# Patient Record
Sex: Male | Born: 1985 | Race: Black or African American | Hispanic: No | Marital: Single
Health system: Southern US, Community
[De-identification: ages and names within clinical notes are randomized; demographics above are authoritative.]

## PROBLEM LIST (undated history)

## (undated) DIAGNOSIS — I10 Essential (primary) hypertension: Secondary | ICD-10-CM

---

## 2007-06-28 ENCOUNTER — Telehealth (INDEPENDENT_AMBULATORY_CARE_PROVIDER_SITE_OTHER): Payer: Self-pay | Admitting: *Deleted

## 2007-06-28 ENCOUNTER — Ambulatory Visit: Payer: Self-pay | Admitting: Family Medicine

## 2007-06-28 ENCOUNTER — Ambulatory Visit (HOSPITAL_COMMUNITY): Admission: RE | Admit: 2007-06-28 | Discharge: 2007-06-28 | Payer: Self-pay | Admitting: Family Medicine

## 2007-06-28 DIAGNOSIS — R55 Syncope and collapse: Secondary | ICD-10-CM | POA: Insufficient documentation

## 2007-06-28 DIAGNOSIS — Z8679 Personal history of other diseases of the circulatory system: Secondary | ICD-10-CM | POA: Insufficient documentation

## 2007-06-28 DIAGNOSIS — R0609 Other forms of dyspnea: Secondary | ICD-10-CM | POA: Insufficient documentation

## 2007-06-28 DIAGNOSIS — R0602 Shortness of breath: Secondary | ICD-10-CM | POA: Insufficient documentation

## 2007-06-29 ENCOUNTER — Encounter (INDEPENDENT_AMBULATORY_CARE_PROVIDER_SITE_OTHER): Payer: Self-pay | Admitting: Family Medicine

## 2007-07-12 ENCOUNTER — Ambulatory Visit: Payer: Self-pay | Admitting: Family Medicine

## 2007-07-12 ENCOUNTER — Telehealth (INDEPENDENT_AMBULATORY_CARE_PROVIDER_SITE_OTHER): Payer: Self-pay | Admitting: *Deleted

## 2007-07-23 ENCOUNTER — Ambulatory Visit: Admission: RE | Admit: 2007-07-23 | Discharge: 2007-07-23 | Payer: Self-pay | Admitting: Family Medicine

## 2007-07-23 ENCOUNTER — Encounter (INDEPENDENT_AMBULATORY_CARE_PROVIDER_SITE_OTHER): Payer: Self-pay | Admitting: Family Medicine

## 2007-07-26 ENCOUNTER — Ambulatory Visit (HOSPITAL_COMMUNITY): Admission: RE | Admit: 2007-07-26 | Discharge: 2007-07-26 | Payer: Self-pay | Admitting: Family Medicine

## 2007-07-26 ENCOUNTER — Telehealth (INDEPENDENT_AMBULATORY_CARE_PROVIDER_SITE_OTHER): Payer: Self-pay | Admitting: *Deleted

## 2007-07-26 ENCOUNTER — Encounter (INDEPENDENT_AMBULATORY_CARE_PROVIDER_SITE_OTHER): Payer: Self-pay | Admitting: Family Medicine

## 2007-07-27 ENCOUNTER — Encounter (INDEPENDENT_AMBULATORY_CARE_PROVIDER_SITE_OTHER): Payer: Self-pay | Admitting: Family Medicine

## 2007-07-31 ENCOUNTER — Ambulatory Visit: Payer: Self-pay | Admitting: Pulmonary Disease

## 2007-08-09 ENCOUNTER — Encounter (INDEPENDENT_AMBULATORY_CARE_PROVIDER_SITE_OTHER): Payer: Self-pay | Admitting: Family Medicine

## 2007-08-15 ENCOUNTER — Telehealth (INDEPENDENT_AMBULATORY_CARE_PROVIDER_SITE_OTHER): Payer: Self-pay | Admitting: *Deleted

## 2007-08-16 ENCOUNTER — Encounter (INDEPENDENT_AMBULATORY_CARE_PROVIDER_SITE_OTHER): Payer: Self-pay | Admitting: Family Medicine

## 2007-08-20 ENCOUNTER — Telehealth (INDEPENDENT_AMBULATORY_CARE_PROVIDER_SITE_OTHER): Payer: Self-pay | Admitting: *Deleted

## 2008-12-03 ENCOUNTER — Encounter (INDEPENDENT_AMBULATORY_CARE_PROVIDER_SITE_OTHER): Payer: Self-pay | Admitting: Family Medicine

## 2010-01-17 IMAGING — CR DG CHEST 2V
2 series · 2 of 2 positions shown · non-contrast
Comparison: None

CLINICAL DATA: DYSPNEA, SYNCOPE, MORBID OBESITY, COUGH

CHEST - 2 VIEW

[view not recorded (1 of 2)]
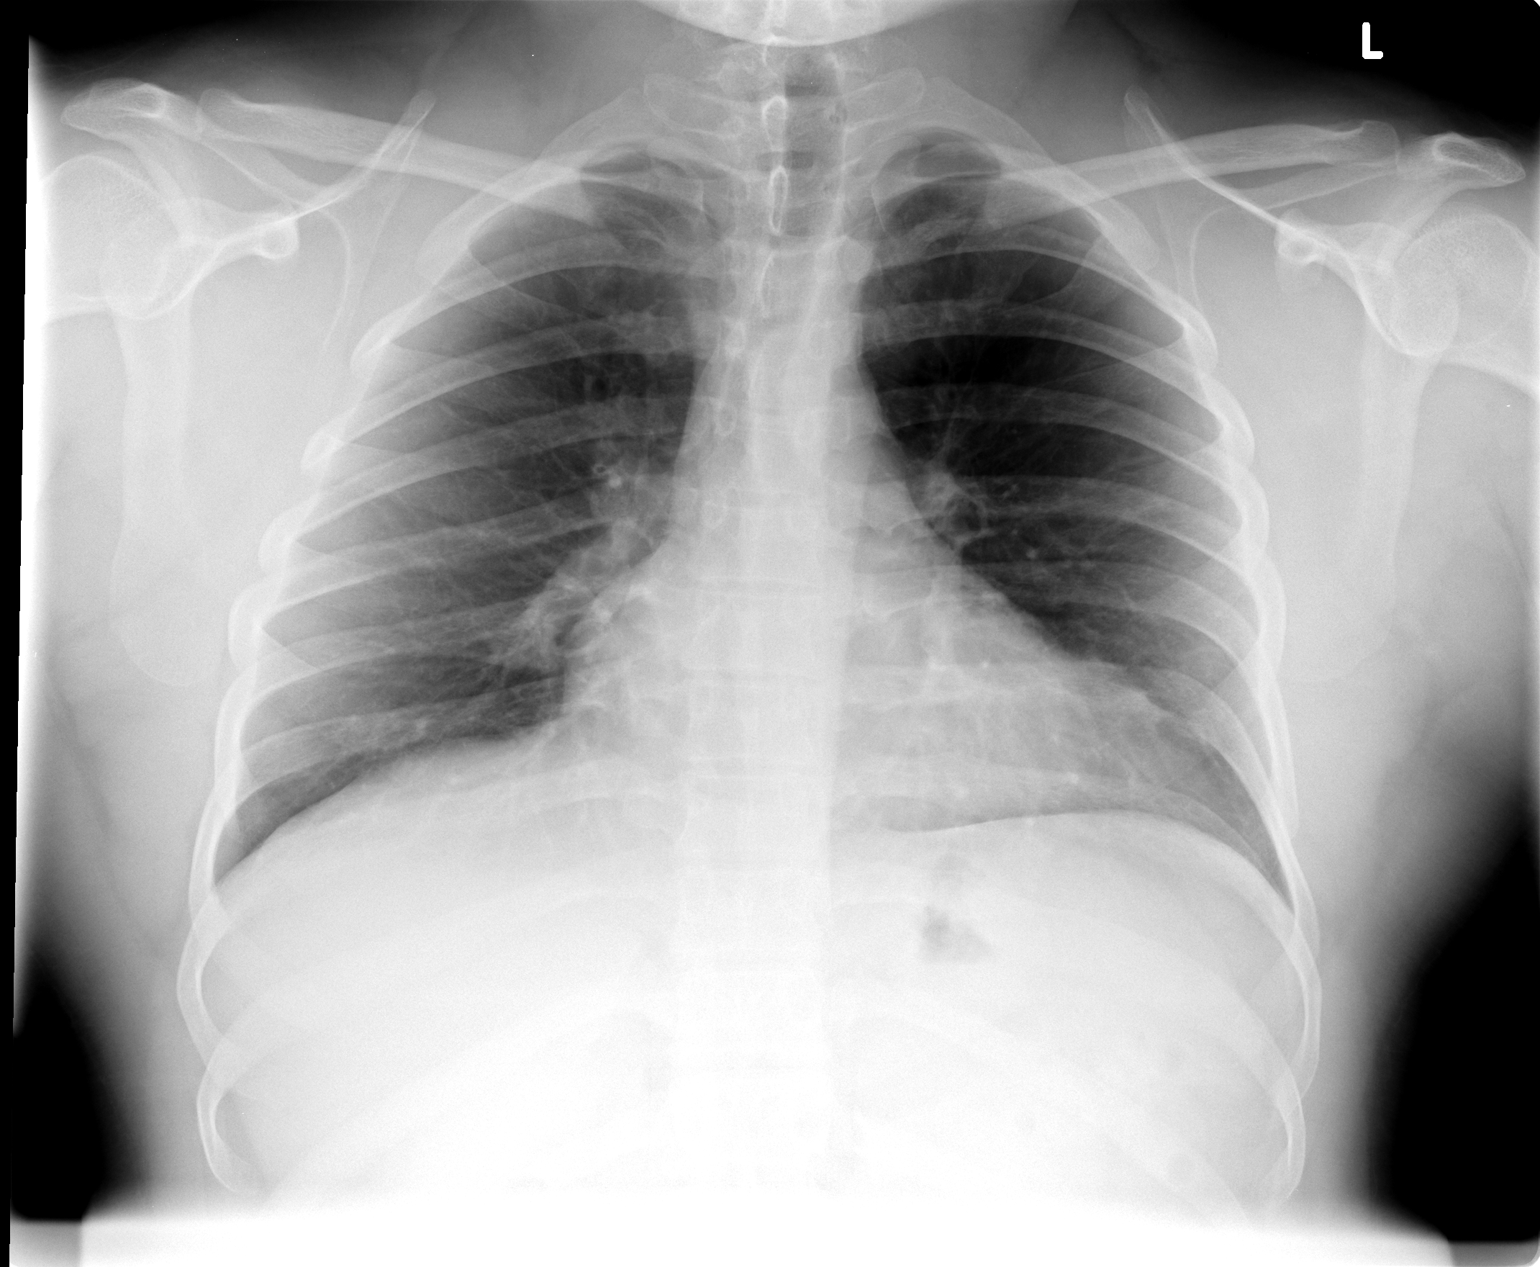

[view not recorded (2 of 2)]
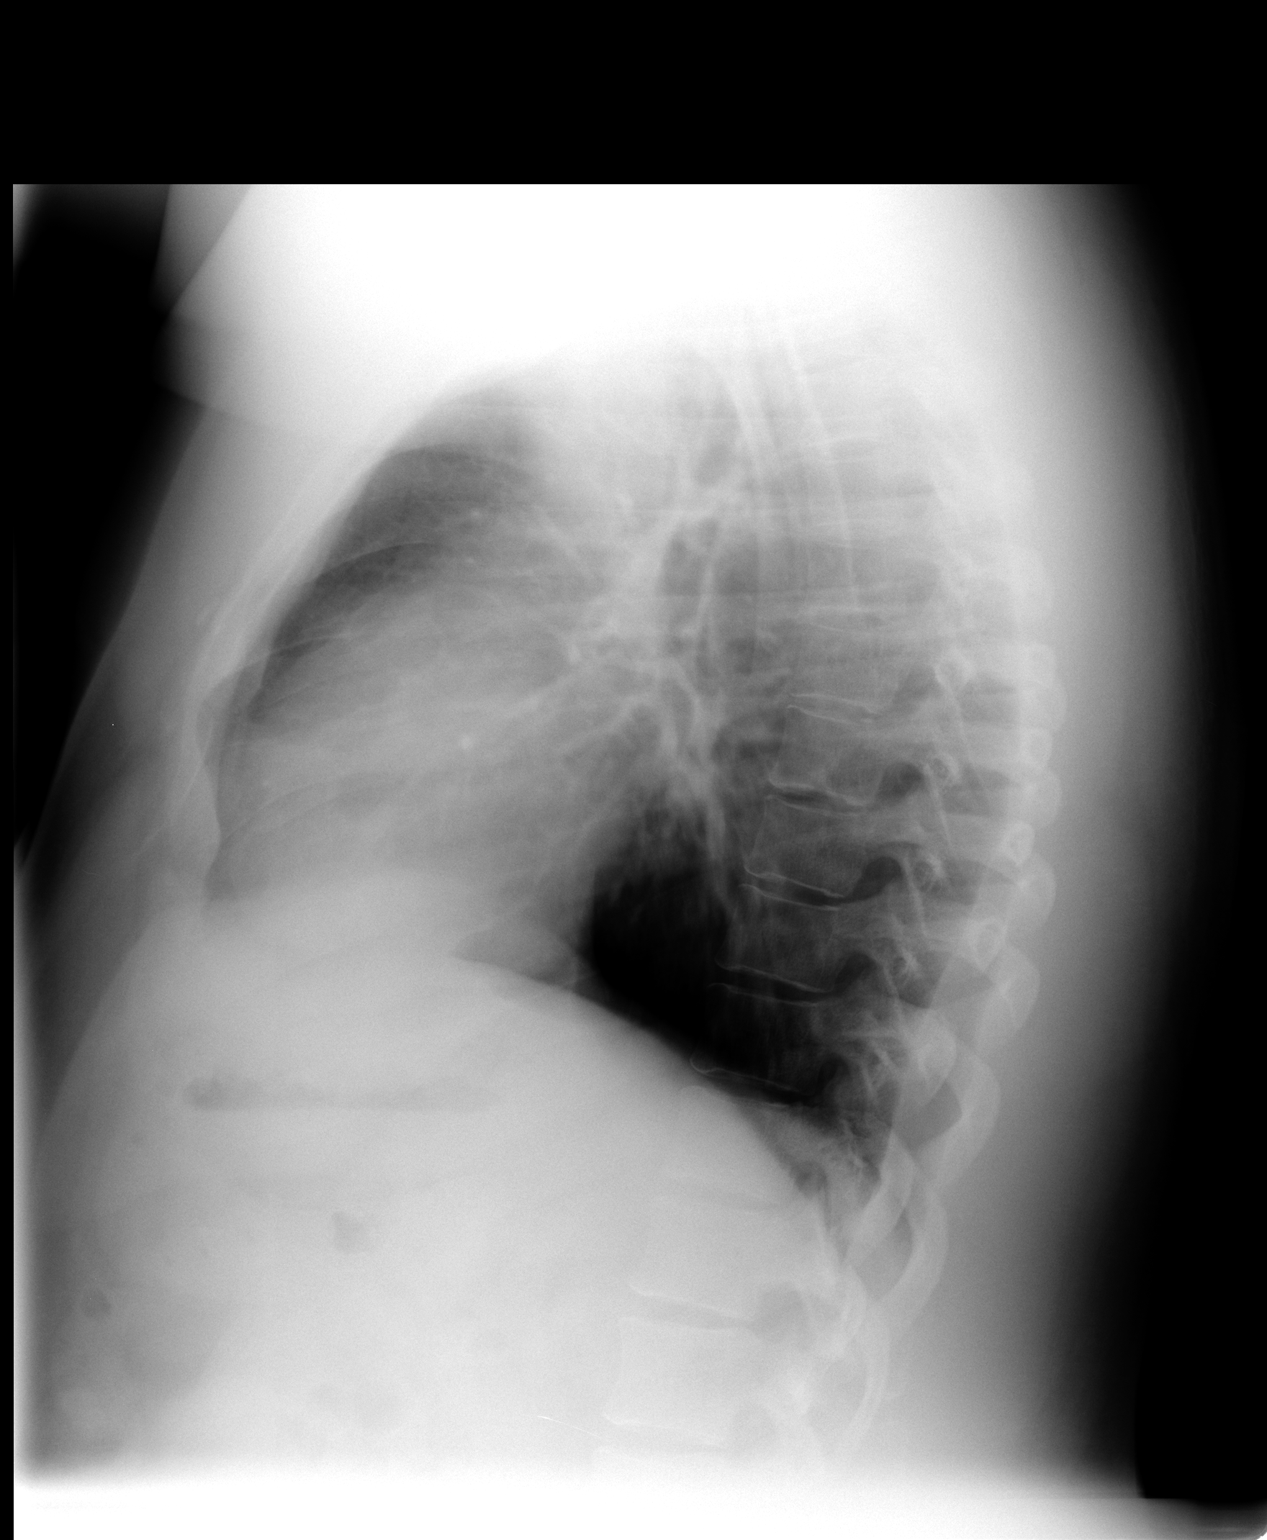

[2 of 2 positions shown; findings below may reference images not displayed]

FINDINGS: Upper normal heart size.
Normal mediastinal contours and pulmonary vascularity.
Minimal peribronchial thickening with slightly decreased basilar
lung volumes.
No infiltrate, effusion, or pneumothorax.
IMPRESSION: Minimal bronchitic changes.

## 2010-08-17 NOTE — Procedures (Signed)
NAME:  Shawn Duke, PIENTA NO.:  [id number]   MEDICAL RECORD NO.:  [id number]          PATIENT TYPE:  OUT   LOCATION:  SLEEP LAB                     FACILITY:  [facility]   PHYSICIAN:  Barbaraann Share, MD,FCCPDATE OF BIRTH:  10[birth year]15/84   DATE OF STUDY:  07/23/2007                            NOCTURNAL POLYSOMNOGRAM   REFERRING PHYSICIAN:  Franchot Heidelberg, M.D.   REFERRING PHYSICIAN:  Franchot Heidelberg, M.D.   INDICATION FOR STUDY:  Hypersomnolence with sleep apnea.   EPWORTH SLEEPINESS SCORE:  6.   SLEEP ARCHITECTURE:  The patient had a total sleep time of 204 minutes  with very little slow wave sleep and REM.  Sleep onset latency was  prolonged at 109 minutes and REM onset was mildly prolonged at 98  minutes.  Sleep efficiency was very poor at 57%.   RESPIRATORY DATA:  The patient was found to have 9 apneas and 31  hypopneas for an apnea/hypopnea index of 12 events per hour.  The events  occurred in all body positions, and there was very loud snoring noted  throughout.   OXYGEN DATA:  There was O2 desaturation as low as 84% with the patient's  obstructive events.   CARDIAC DATA:  No clinically significant arrhythmias were noted.   MOVEMENT-PARASOMNIA:  The patient was found to have no periodic leg  movements or abnormal behaviors.   IMPRESSIONS/RECOMMENDATIONS:  Mild obstructive sleep apnea/hypopnea  syndrome with an apnea/hypopnea index of 12 events per hour and O2  desaturation as low as 84%.  The patient did not meet split night  criteria secondary to most of his events occurring well after 1:00 a.m.  Treatment for this degree of sleep apnea can include weight loss alone  if applicable, upper airway surgery, oral appliance, and also CPAP.  Clinical correlation is suggested.     Barbaraann Share, MD,FCCP  Diplomate, American Board of Sleep  Medicine  Electronically Signed    [facility]/MED[facility]  D:  07/31/2007 16:18:32  T:  07/31/2007 18:04:32  Job:  [id number]

## 2010-08-17 NOTE — Procedures (Signed)
NAME:  Shawn Duke, DORTON NO.:  [id number]   MEDICAL RECORD NO.:  [id number]          PATIENT TYPE:  OUT   LOCATION:  RESP                          FACILITY:  [facility]   PHYSICIAN:  Edward L. Juanetta Gosling, M.D.DATE OF BIRTH:  Jul 21, 1985   DATE OF PROCEDURE:  07/28/2007  DATE OF DISCHARGE:  07/26/2007                            PULMONARY FUNCTION TEST   1. Spirometry shows a moderate-to-severe ventilatory defect with      evidence of airflow obstruction.  2. Lung volumes are normal.  3. DLCO is moderately reduced.  4. Blood gases show very slight elevation of pCO2 suggesting possible      chronic CO2 retention.  5. There is no significant response to inhaled bronchodilator.      Edward L. Juanetta Gosling, M.D.  Electronically Signed     [facility]/MEDQ  D:  07/28/2007  T:  07/28/2007  Job:  [id number]   cc:   Franchot Heidelberg, M.D.

## 2010-12-28 LAB — BLOOD GAS, ARTERIAL
Acid-Base Excess: 1
O2 Saturation: 96.1
Patient temperature: 37
pO2, Arterial: 84.6

## 2015-02-13 ENCOUNTER — Encounter (HOSPITAL_COMMUNITY): Payer: Self-pay

## 2015-02-13 ENCOUNTER — Emergency Department (HOSPITAL_COMMUNITY)
Admission: EM | Admit: 2015-02-13 | Discharge: 2015-02-13 | Disposition: A | Discharge status: Home / Self Care | Payer: BLUE CROSS/BLUE SHIELD | Source: Home / Self Care

## 2015-02-13 DIAGNOSIS — R51 Headache: Secondary | ICD-10-CM | POA: Diagnosis not present

## 2015-02-13 DIAGNOSIS — R519 Headache, unspecified: Secondary | ICD-10-CM

## 2015-02-13 DIAGNOSIS — I10 Essential (primary) hypertension: Secondary | ICD-10-CM | POA: Diagnosis not present

## 2015-02-13 HISTORY — DX: Essential (primary) hypertension: I10

## 2015-02-13 MED ORDER — HYDROCHLOROTHIAZIDE 25 MG PO TABS: 25.0000 mg | ORAL_TABLET | Freq: Every day | ORAL | Status: AC

## 2015-02-13 NOTE — ED Notes (Signed)
eval by PA.

## 2015-02-13 NOTE — ED Provider Notes (Signed)
CSN: [id number]     Arrival date & time 02/13/15  1746 History   None    No chief complaint on file.  (Consider location/radiation/quality/duration/timing/severity/associated sxs/prior Treatment) HPI History obtained from patient:   LOCATION:head SEVERITY: 4 DURATION: 2 weeks CONTEXT: [place] blood [place] QUALITY: MODIFYING FACTORS: Tylenol on occasion ASSOCIATED SYMPTOMS: None TIMING: Constant OCCUPATION:  No past medical history on file. No past surgical history on file. No family history on file. Social History  Substance Use Topics  . Smoking status: Not on file  . Smokeless tobacco: Not on file  . Alcohol Use: Not on file    Review of Systems ROS +'ve Headache  Denies:   NAUSEA, ABDOMINAL PAIN, CHEST PAIN, CONGESTION, DYSURIA, SHORTNESS OF BREATH  Allergies  Review of patient's allergies indicates not on file.  Home Medications   Prior to Admission medications   Not on File   Meds Ordered and Administered this Visit  Medications - No data to display  There were no vitals taken for this visit. No data found.   Physical Exam NURSES NOTES AND VITAL SIGNS REVIEWED. CONSTITUTIONAL: Well developed, well nourished, no acute distress HEENT: normocephalic, atraumatic EYES: Conjunctiva normal NECK:normal ROM, supple PULMONARY:No respiratory distress, normal effort, Lungs: CTAb/l CARDIOVASCULAR: RRR, no murmur ABDOMEN: soft, ND, NT, +'ve BS MUSCULOSKELETAL: Normal ROM of all extremities SKIN: warm and dry without rash PSYCHIATRIC: Mood and affect normal  ED Course  Procedures (including critical care time)  Labs Review Labs Reviewed - No data to display  Imaging Review No results found.   Visual Acuity Review  Right Eye Distance:   Left Eye Distance:   Bilateral Distance:    Right Eye Near:   Left Eye Near:    Bilateral Near:         MDM   1. Essential hypertension   2. Nonintractable headache,  unspecified chronicity pattern, unspecified headache type    I have discussed with patient and there is no indications for advanced testing at this time. I have suggested that he start taking his hydrochlorothiazide again limited prescription is provided for him for that neurologic exam is absolutely normal at this time with no papilledema and no acute indication for head CT patient is not having thunderclap headaches. Instructions of care provided patient is discharged home in stable condition.  THIS NOTE WAS GENERATED USING A VOICE RECOGNITION SOFTWARE PROGRAM. ALL REASONABLE EFFORTS  WERE MADE TO PROOFREAD THIS DOCUMENT FOR ACCURACY.     Tharon AquasFrank C Zidan Helget, PA 02/13/15 [phone]

## 2015-02-13 NOTE — Discharge Instructions (Signed)
Hypertension °Hypertension, commonly called high blood pressure, is when the force of blood pumping through your arteries is too strong. Your arteries are the blood vessels that carry blood from your heart throughout your body. A blood pressure reading consists of a higher number over a lower number, such as 110/72. The higher number (systolic) is the pressure inside your arteries when your heart pumps. The lower number (diastolic) is the pressure inside your arteries when your heart relaxes. Ideally you want your blood pressure below 120/80. °Hypertension forces your heart to work harder to pump blood. Your arteries may become narrow or stiff. Having untreated or uncontrolled hypertension can cause heart attack, stroke, kidney disease, and other problems. °RISK FACTORS °Some risk factors for high blood pressure are controllable. Others are not.  °Risk factors you cannot control include:  °· Race. You may be at higher risk if you are African American. °· Age. Risk increases with age. °· Gender. Men are at higher risk than women before age 45 years. After age 65, women are at higher risk than men. °Risk factors you can control include: °· Not getting enough exercise or physical activity. °· Being overweight. °· Getting too much fat, sugar, calories, or salt in your diet. °· Drinking too much alcohol. °SIGNS AND SYMPTOMS °Hypertension does not usually cause signs or symptoms. Extremely high blood pressure (hypertensive crisis) may cause headache, anxiety, shortness of breath, and nosebleed. °DIAGNOSIS °To check if you have hypertension, your health care provider will measure your blood pressure while you are seated, with your arm held at the level of your heart. It should be measured at least twice using the same arm. Certain conditions can cause a difference in blood pressure between your right and left arms. A blood pressure reading that is higher than normal on one occasion does not mean that you need treatment. If  it is not clear whether you have high blood pressure, you may be asked to return on a different day to have your blood pressure checked again. Or, you may be asked to monitor your blood pressure at home for 1 or more weeks. °TREATMENT °Treating high blood pressure includes making lifestyle changes and possibly taking medicine. Living a healthy lifestyle can help lower high blood pressure. You may need to change some of your habits. °Lifestyle changes may include: °· Following the DASH diet. This diet is high in fruits, vegetables, and whole grains. It is low in salt, red meat, and added sugars. °· Keep your sodium intake below 2,300 mg per day. °· Getting at least 30-45 minutes of aerobic exercise at least 4 times per week. °· Losing weight if necessary. °· Not smoking. °· Limiting alcoholic beverages. °· Learning ways to reduce stress. °Your health care provider may prescribe medicine if lifestyle changes are not enough to get your blood pressure under control, and if one of the following is true: °· You are 18-59 years of age and your systolic blood pressure is above 140. °· You are 60 years of age or older, and your systolic blood pressure is above 150. °· Your diastolic blood pressure is above 90. °· You have diabetes, and your systolic blood pressure is over 140 or your diastolic blood pressure is over 90. °· You have kidney disease and your blood pressure is above 140/90. °· You have heart disease and your blood pressure is above 140/90. °Your personal target blood pressure may vary depending on your medical conditions, your age, and other factors. °HOME CARE INSTRUCTIONS °·   Have your blood pressure rechecked as directed by your health care provider.   °· Take medicines only as directed by your health care provider. Follow the directions carefully. Blood pressure medicines must be taken as prescribed. The medicine does not work as well when you skip doses. Skipping doses also puts you at risk for  problems. °· Do not smoke.   °· Monitor your blood pressure at home as directed by your health care provider.  °SEEK MEDICAL CARE IF:  °· You think you are having a reaction to medicines taken. °· You have recurrent headaches or feel dizzy. °· You have swelling in your ankles. °· You have trouble with your vision. °SEEK IMMEDIATE MEDICAL CARE IF: °· You develop a severe headache or confusion. °· You have unusual weakness, numbness, or feel faint. °· You have severe chest or abdominal pain. °· You vomit repeatedly. °· You have trouble breathing. °MAKE SURE YOU:  °· Understand these instructions. °· Will watch your condition. °· Will get help right away if you are not doing well or get worse. °  °This information is not intended to replace advice given to you by your health care provider. Make sure you discuss any questions you have with your health care provider. °  °Document Released: 03/21/2005 Document Revised: 08/05/2014 Document Reviewed: 01/11/2013 °Elsevier Interactive Patient Education ©2016 Elsevier [employer] ° °General Headache Without Cause °A headache is pain or discomfort felt around the head or neck area. There are many causes and types of headaches. In some cases, the cause may not be found.  °HOME CARE  °Managing Pain °· Take over-the-counter and prescription medicines only as told by your doctor. °· Lie down in a dark, quiet room when you have a headache. °· If directed, apply ice to the head and neck area: °¨ Put ice in a plastic bag. °¨ Place a towel between your skin and the bag. °¨ Leave the ice on for 20 minutes, 2-3 times per day. °· Use a heating pad or hot shower to apply heat to the head and neck area as told by your doctor. °· Keep lights dim if bright lights bother you or make your headaches worse. °Eating and Drinking °· Eat meals on a regular schedule. °· Lessen how much alcohol you drink. °· Lessen how much caffeine you drink, or stop drinking caffeine. °General Instructions °· Keep all  follow-up visits as told by your doctor. This is important. °· Keep a journal to find out if certain things bring on headaches. For example, write down: °¨ What you eat and drink. °¨ How much sleep you get. °¨ Any change to your diet or medicines. °· Relax by getting a massage or doing other relaxing activities. °· Lessen stress. °· Sit up straight. Do not tighten (tense) your muscles. °· Do not use tobacco products. This includes cigarettes, chewing tobacco, or e-cigarettes. If you need help quitting, ask your doctor. °· Exercise regularly as told by your doctor. °· Get enough sleep. This often means 7-9 hours of sleep. °GET HELP IF: °· Your symptoms are not helped by medicine. °· You have a headache that feels different than the other headaches. °· You feel sick to your stomach (nauseous) or you throw up (vomit). °· You have a fever. °GET HELP RIGHT AWAY IF:  °· Your headache becomes really bad. °· You keep throwing up. °· You have a stiff neck. °· You have trouble seeing. °· You have trouble speaking. °· You have pain in the eye or ear. °·   Your muscles are weak or you lose muscle control. °· You lose your balance or have trouble walking. °· You feel like you will pass out (faint) or you pass out. °· You have confusion. °  °This information is not intended to replace advice given to you by your health care provider. Make sure you discuss any questions you have with your health care provider. °  °Document Released: 12/29/2007 Document Revised: 12/10/2014 Document Reviewed: 07/14/2014 °Elsevier Interactive Patient Education ©2016 Elsevier Inc. ° °

## 2015-12-31 DIAGNOSIS — I10 Essential (primary) hypertension: Secondary | ICD-10-CM | POA: Diagnosis not present

## 2016-01-02 DIAGNOSIS — Z79899 Other long term (current) drug therapy: Secondary | ICD-10-CM | POA: Diagnosis not present

## 2016-01-02 DIAGNOSIS — I10 Essential (primary) hypertension: Secondary | ICD-10-CM | POA: Diagnosis not present

## 2016-01-02 DIAGNOSIS — R51 Headache: Secondary | ICD-10-CM | POA: Diagnosis not present

## 2016-01-02 DIAGNOSIS — E669 Obesity, unspecified: Secondary | ICD-10-CM | POA: Diagnosis not present

## 2016-01-02 DIAGNOSIS — G4489 Other headache syndrome: Secondary | ICD-10-CM | POA: Diagnosis not present

## 2016-01-06 DIAGNOSIS — I10 Essential (primary) hypertension: Secondary | ICD-10-CM | POA: Diagnosis not present

## 2016-03-01 DIAGNOSIS — J45901 Unspecified asthma with (acute) exacerbation: Secondary | ICD-10-CM | POA: Diagnosis not present

## 2016-03-01 DIAGNOSIS — J189 Pneumonia, unspecified organism: Secondary | ICD-10-CM | POA: Diagnosis not present

## 2016-05-16 DIAGNOSIS — I10 Essential (primary) hypertension: Secondary | ICD-10-CM | POA: Diagnosis not present

## 2016-05-16 DIAGNOSIS — J4521 Mild intermittent asthma with (acute) exacerbation: Secondary | ICD-10-CM | POA: Diagnosis not present

## 2016-05-16 DIAGNOSIS — Z6841 Body Mass Index (BMI) 40.0 and over, adult: Secondary | ICD-10-CM | POA: Diagnosis not present

## 2016-05-16 DIAGNOSIS — G4733 Obstructive sleep apnea (adult) (pediatric): Secondary | ICD-10-CM | POA: Diagnosis not present

## 2016-05-16 DIAGNOSIS — Z79899 Other long term (current) drug therapy: Secondary | ICD-10-CM | POA: Diagnosis not present

## 2016-05-26 DIAGNOSIS — Z6841 Body Mass Index (BMI) 40.0 and over, adult: Secondary | ICD-10-CM | POA: Diagnosis not present

## 2016-05-26 DIAGNOSIS — I10 Essential (primary) hypertension: Secondary | ICD-10-CM | POA: Diagnosis not present

## 2016-08-08 DIAGNOSIS — I10 Essential (primary) hypertension: Secondary | ICD-10-CM | POA: Diagnosis not present

## 2016-08-08 DIAGNOSIS — J4541 Moderate persistent asthma with (acute) exacerbation: Secondary | ICD-10-CM | POA: Diagnosis not present

## 2016-08-08 DIAGNOSIS — Z6841 Body Mass Index (BMI) 40.0 and over, adult: Secondary | ICD-10-CM | POA: Diagnosis not present

## 2016-11-11 DIAGNOSIS — G4733 Obstructive sleep apnea (adult) (pediatric): Secondary | ICD-10-CM | POA: Diagnosis not present

## 2016-11-11 DIAGNOSIS — Z79899 Other long term (current) drug therapy: Secondary | ICD-10-CM | POA: Diagnosis not present

## 2016-11-11 DIAGNOSIS — J4541 Moderate persistent asthma with (acute) exacerbation: Secondary | ICD-10-CM | POA: Diagnosis not present

## 2016-11-11 DIAGNOSIS — I10 Essential (primary) hypertension: Secondary | ICD-10-CM | POA: Diagnosis not present

## 2016-12-14 DIAGNOSIS — H52203 Unspecified astigmatism, bilateral: Secondary | ICD-10-CM | POA: Diagnosis not present

## 2016-12-14 DIAGNOSIS — H5203 Hypermetropia, bilateral: Secondary | ICD-10-CM | POA: Diagnosis not present

## 2016-12-16 DIAGNOSIS — R0789 Other chest pain: Secondary | ICD-10-CM | POA: Diagnosis not present

## 2016-12-16 DIAGNOSIS — M6282 Rhabdomyolysis: Secondary | ICD-10-CM | POA: Diagnosis not present

## 2016-12-16 DIAGNOSIS — N289 Disorder of kidney and ureter, unspecified: Secondary | ICD-10-CM | POA: Diagnosis not present

## 2016-12-16 DIAGNOSIS — R0602 Shortness of breath: Secondary | ICD-10-CM | POA: Diagnosis not present

## 2016-12-16 DIAGNOSIS — I10 Essential (primary) hypertension: Secondary | ICD-10-CM | POA: Diagnosis not present

## 2016-12-16 DIAGNOSIS — G459 Transient cerebral ischemic attack, unspecified: Secondary | ICD-10-CM | POA: Diagnosis not present

## 2016-12-16 DIAGNOSIS — R55 Syncope and collapse: Secondary | ICD-10-CM | POA: Diagnosis not present

## 2016-12-16 DIAGNOSIS — R079 Chest pain, unspecified: Secondary | ICD-10-CM | POA: Diagnosis not present

## 2016-12-16 DIAGNOSIS — Z880 Allergy status to penicillin: Secondary | ICD-10-CM | POA: Diagnosis not present

## 2016-12-16 DIAGNOSIS — G458 Other transient cerebral ischemic attacks and related syndromes: Secondary | ICD-10-CM | POA: Diagnosis not present

## 2016-12-16 DIAGNOSIS — Z6841 Body Mass Index (BMI) 40.0 and over, adult: Secondary | ICD-10-CM | POA: Diagnosis not present

## 2016-12-16 DIAGNOSIS — Z79899 Other long term (current) drug therapy: Secondary | ICD-10-CM | POA: Diagnosis not present

## 2016-12-17 DIAGNOSIS — R0789 Other chest pain: Secondary | ICD-10-CM | POA: Diagnosis not present

## 2016-12-17 DIAGNOSIS — I10 Essential (primary) hypertension: Secondary | ICD-10-CM | POA: Diagnosis not present

## 2016-12-17 DIAGNOSIS — M6282 Rhabdomyolysis: Secondary | ICD-10-CM | POA: Diagnosis not present

## 2016-12-17 DIAGNOSIS — R079 Chest pain, unspecified: Secondary | ICD-10-CM | POA: Diagnosis not present

## 2016-12-17 DIAGNOSIS — N289 Disorder of kidney and ureter, unspecified: Secondary | ICD-10-CM | POA: Diagnosis not present

## 2016-12-17 DIAGNOSIS — Z880 Allergy status to penicillin: Secondary | ICD-10-CM | POA: Diagnosis not present

## 2016-12-17 DIAGNOSIS — R0602 Shortness of breath: Secondary | ICD-10-CM | POA: Diagnosis not present

## 2016-12-17 DIAGNOSIS — G459 Transient cerebral ischemic attack, unspecified: Secondary | ICD-10-CM | POA: Diagnosis not present

## 2016-12-17 DIAGNOSIS — Z79899 Other long term (current) drug therapy: Secondary | ICD-10-CM | POA: Diagnosis not present

## 2016-12-17 DIAGNOSIS — Z6841 Body Mass Index (BMI) 40.0 and over, adult: Secondary | ICD-10-CM | POA: Diagnosis not present

## 2016-12-17 DIAGNOSIS — G458 Other transient cerebral ischemic attacks and related syndromes: Secondary | ICD-10-CM | POA: Diagnosis not present

## 2016-12-19 DIAGNOSIS — I208 Other forms of angina pectoris: Secondary | ICD-10-CM | POA: Diagnosis not present

## 2016-12-19 DIAGNOSIS — J4540 Moderate persistent asthma, uncomplicated: Secondary | ICD-10-CM | POA: Diagnosis not present

## 2016-12-19 DIAGNOSIS — Z6841 Body Mass Index (BMI) 40.0 and over, adult: Secondary | ICD-10-CM | POA: Diagnosis not present

## 2017-04-11 DIAGNOSIS — G4733 Obstructive sleep apnea (adult) (pediatric): Secondary | ICD-10-CM | POA: Diagnosis not present

## 2017-04-11 DIAGNOSIS — M25861 Other specified joint disorders, right knee: Secondary | ICD-10-CM | POA: Diagnosis not present

## 2017-04-11 DIAGNOSIS — I10 Essential (primary) hypertension: Secondary | ICD-10-CM | POA: Diagnosis not present

## 2017-04-11 DIAGNOSIS — J4540 Moderate persistent asthma, uncomplicated: Secondary | ICD-10-CM | POA: Diagnosis not present

## 2017-05-15 DIAGNOSIS — Z6841 Body Mass Index (BMI) 40.0 and over, adult: Secondary | ICD-10-CM | POA: Diagnosis not present

## 2017-05-15 DIAGNOSIS — I10 Essential (primary) hypertension: Secondary | ICD-10-CM | POA: Diagnosis not present

## 2017-07-02 DIAGNOSIS — R0789 Other chest pain: Secondary | ICD-10-CM | POA: Diagnosis not present

## 2017-07-02 DIAGNOSIS — I10 Essential (primary) hypertension: Secondary | ICD-10-CM | POA: Diagnosis not present

## 2017-11-07 DIAGNOSIS — Z9114 Patient's other noncompliance with medication regimen: Secondary | ICD-10-CM | POA: Diagnosis not present

## 2017-11-07 DIAGNOSIS — I10 Essential (primary) hypertension: Secondary | ICD-10-CM | POA: Diagnosis not present

## 2017-11-07 DIAGNOSIS — E669 Obesity, unspecified: Secondary | ICD-10-CM | POA: Diagnosis not present

## 2017-11-07 DIAGNOSIS — J449 Chronic obstructive pulmonary disease, unspecified: Secondary | ICD-10-CM | POA: Diagnosis not present

## 2017-11-07 DIAGNOSIS — R51 Headache: Secondary | ICD-10-CM | POA: Diagnosis not present

## 2017-11-07 DIAGNOSIS — I169 Hypertensive crisis, unspecified: Secondary | ICD-10-CM | POA: Diagnosis not present

## 2017-11-07 DIAGNOSIS — Z79899 Other long term (current) drug therapy: Secondary | ICD-10-CM | POA: Diagnosis not present

## 2017-11-13 DIAGNOSIS — Z6841 Body Mass Index (BMI) 40.0 and over, adult: Secondary | ICD-10-CM | POA: Diagnosis not present

## 2017-11-13 DIAGNOSIS — I10 Essential (primary) hypertension: Secondary | ICD-10-CM | POA: Diagnosis not present

## 2017-11-21 DIAGNOSIS — I10 Essential (primary) hypertension: Secondary | ICD-10-CM | POA: Diagnosis not present

## 2017-11-21 DIAGNOSIS — Z0000 Encounter for general adult medical examination without abnormal findings: Secondary | ICD-10-CM | POA: Diagnosis not present

## 2017-11-21 DIAGNOSIS — Z6841 Body Mass Index (BMI) 40.0 and over, adult: Secondary | ICD-10-CM | POA: Diagnosis not present

## 2017-12-12 DIAGNOSIS — I10 Essential (primary) hypertension: Secondary | ICD-10-CM | POA: Diagnosis not present

## 2017-12-12 DIAGNOSIS — Z6841 Body Mass Index (BMI) 40.0 and over, adult: Secondary | ICD-10-CM | POA: Diagnosis not present

## 2017-12-17 DIAGNOSIS — R05 Cough: Secondary | ICD-10-CM | POA: Diagnosis not present

## 2017-12-17 DIAGNOSIS — I10 Essential (primary) hypertension: Secondary | ICD-10-CM | POA: Diagnosis not present

## 2017-12-17 DIAGNOSIS — Z79899 Other long term (current) drug therapy: Secondary | ICD-10-CM | POA: Diagnosis not present

## 2017-12-17 DIAGNOSIS — J40 Bronchitis, not specified as acute or chronic: Secondary | ICD-10-CM | POA: Diagnosis not present

## 2017-12-17 DIAGNOSIS — Z8249 Family history of ischemic heart disease and other diseases of the circulatory system: Secondary | ICD-10-CM | POA: Diagnosis not present

## 2017-12-17 DIAGNOSIS — J449 Chronic obstructive pulmonary disease, unspecified: Secondary | ICD-10-CM | POA: Diagnosis not present

## 2017-12-17 DIAGNOSIS — J209 Acute bronchitis, unspecified: Secondary | ICD-10-CM | POA: Diagnosis not present

## 2017-12-18 DIAGNOSIS — S39011A Strain of muscle, fascia and tendon of abdomen, initial encounter: Secondary | ICD-10-CM | POA: Diagnosis not present

## 2017-12-25 DIAGNOSIS — I10 Essential (primary) hypertension: Secondary | ICD-10-CM | POA: Diagnosis not present

## 2017-12-25 DIAGNOSIS — Z6841 Body Mass Index (BMI) 40.0 and over, adult: Secondary | ICD-10-CM | POA: Diagnosis not present

## 2018-01-08 DIAGNOSIS — Z6841 Body Mass Index (BMI) 40.0 and over, adult: Secondary | ICD-10-CM | POA: Diagnosis not present

## 2018-01-08 DIAGNOSIS — I150 Renovascular hypertension: Secondary | ICD-10-CM | POA: Diagnosis not present

## 2018-01-16 DIAGNOSIS — J449 Chronic obstructive pulmonary disease, unspecified: Secondary | ICD-10-CM | POA: Diagnosis not present

## 2018-01-16 DIAGNOSIS — I10 Essential (primary) hypertension: Secondary | ICD-10-CM | POA: Diagnosis not present

## 2018-01-16 DIAGNOSIS — N183 Chronic kidney disease, stage 3 (moderate): Secondary | ICD-10-CM | POA: Diagnosis not present

## 2018-02-06 DIAGNOSIS — R05 Cough: Secondary | ICD-10-CM | POA: Diagnosis not present

## 2018-02-06 DIAGNOSIS — R1110 Vomiting, unspecified: Secondary | ICD-10-CM | POA: Diagnosis not present

## 2018-02-06 DIAGNOSIS — Z79899 Other long term (current) drug therapy: Secondary | ICD-10-CM | POA: Diagnosis not present

## 2018-02-06 DIAGNOSIS — R197 Diarrhea, unspecified: Secondary | ICD-10-CM | POA: Diagnosis not present

## 2018-02-06 DIAGNOSIS — J449 Chronic obstructive pulmonary disease, unspecified: Secondary | ICD-10-CM | POA: Diagnosis not present

## 2018-02-06 DIAGNOSIS — J45901 Unspecified asthma with (acute) exacerbation: Secondary | ICD-10-CM | POA: Diagnosis not present

## 2018-02-06 DIAGNOSIS — I10 Essential (primary) hypertension: Secondary | ICD-10-CM | POA: Diagnosis not present

## 2018-02-06 DIAGNOSIS — R531 Weakness: Secondary | ICD-10-CM | POA: Diagnosis not present

## 2018-02-06 DIAGNOSIS — G4730 Sleep apnea, unspecified: Secondary | ICD-10-CM | POA: Diagnosis not present

## 2018-02-14 DIAGNOSIS — Z6841 Body Mass Index (BMI) 40.0 and over, adult: Secondary | ICD-10-CM | POA: Diagnosis not present

## 2018-02-14 DIAGNOSIS — I150 Renovascular hypertension: Secondary | ICD-10-CM | POA: Diagnosis not present

## 2018-03-05 DIAGNOSIS — I10 Essential (primary) hypertension: Secondary | ICD-10-CM | POA: Diagnosis not present

## 2018-03-05 DIAGNOSIS — Z6841 Body Mass Index (BMI) 40.0 and over, adult: Secondary | ICD-10-CM | POA: Diagnosis not present

## 2018-08-20 DIAGNOSIS — Z79899 Other long term (current) drug therapy: Secondary | ICD-10-CM | POA: Diagnosis not present

## 2018-08-20 DIAGNOSIS — N179 Acute kidney failure, unspecified: Secondary | ICD-10-CM | POA: Diagnosis not present

## 2018-08-20 DIAGNOSIS — B9729 Other coronavirus as the cause of diseases classified elsewhere: Secondary | ICD-10-CM | POA: Diagnosis not present

## 2018-08-20 DIAGNOSIS — R279 Unspecified lack of coordination: Secondary | ICD-10-CM | POA: Diagnosis not present

## 2018-08-20 DIAGNOSIS — J449 Chronic obstructive pulmonary disease, unspecified: Secondary | ICD-10-CM | POA: Diagnosis not present

## 2018-08-20 DIAGNOSIS — N183 Chronic kidney disease, stage 3 (moderate): Secondary | ICD-10-CM | POA: Diagnosis not present

## 2018-08-20 DIAGNOSIS — Z8249 Family history of ischemic heart disease and other diseases of the circulatory system: Secondary | ICD-10-CM | POA: Diagnosis not present

## 2018-08-20 DIAGNOSIS — G4733 Obstructive sleep apnea (adult) (pediatric): Secondary | ICD-10-CM | POA: Diagnosis not present

## 2018-08-20 DIAGNOSIS — E662 Morbid (severe) obesity with alveolar hypoventilation: Secondary | ICD-10-CM | POA: Diagnosis not present

## 2018-08-20 DIAGNOSIS — G4730 Sleep apnea, unspecified: Secondary | ICD-10-CM | POA: Diagnosis not present

## 2018-08-20 DIAGNOSIS — J189 Pneumonia, unspecified organism: Secondary | ICD-10-CM | POA: Diagnosis not present

## 2018-08-20 DIAGNOSIS — R5381 Other malaise: Secondary | ICD-10-CM | POA: Diagnosis not present

## 2018-08-20 DIAGNOSIS — R7989 Other specified abnormal findings of blood chemistry: Secondary | ICD-10-CM | POA: Diagnosis not present

## 2018-08-20 DIAGNOSIS — J9602 Acute respiratory failure with hypercapnia: Secondary | ICD-10-CM | POA: Diagnosis not present

## 2018-08-20 DIAGNOSIS — R0902 Hypoxemia: Secondary | ICD-10-CM | POA: Diagnosis not present

## 2018-08-20 DIAGNOSIS — I10 Essential (primary) hypertension: Secondary | ICD-10-CM | POA: Diagnosis not present

## 2018-08-20 DIAGNOSIS — I129 Hypertensive chronic kidney disease with stage 1 through stage 4 chronic kidney disease, or unspecified chronic kidney disease: Secondary | ICD-10-CM | POA: Diagnosis not present

## 2018-08-20 DIAGNOSIS — J440 Chronic obstructive pulmonary disease with acute lower respiratory infection: Secondary | ICD-10-CM | POA: Diagnosis not present

## 2018-08-20 DIAGNOSIS — Z6841 Body Mass Index (BMI) 40.0 and over, adult: Secondary | ICD-10-CM | POA: Diagnosis not present

## 2018-08-20 DIAGNOSIS — J1289 Other viral pneumonia: Secondary | ICD-10-CM | POA: Diagnosis not present

## 2018-08-20 DIAGNOSIS — R0603 Acute respiratory distress: Secondary | ICD-10-CM | POA: Diagnosis not present

## 2018-08-20 DIAGNOSIS — U071 COVID-19: Secondary | ICD-10-CM | POA: Diagnosis not present

## 2018-08-20 DIAGNOSIS — N189 Chronic kidney disease, unspecified: Secondary | ICD-10-CM | POA: Diagnosis not present

## 2018-08-20 DIAGNOSIS — Z880 Allergy status to penicillin: Secondary | ICD-10-CM | POA: Diagnosis not present

## 2018-08-20 DIAGNOSIS — J9601 Acute respiratory failure with hypoxia: Secondary | ICD-10-CM | POA: Diagnosis not present

## 2018-08-20 DIAGNOSIS — M6281 Muscle weakness (generalized): Secondary | ICD-10-CM | POA: Diagnosis not present

## 2018-08-20 DIAGNOSIS — R918 Other nonspecific abnormal finding of lung field: Secondary | ICD-10-CM | POA: Diagnosis not present

## 2018-08-21 DIAGNOSIS — E662 Morbid (severe) obesity with alveolar hypoventilation: Secondary | ICD-10-CM | POA: Diagnosis not present

## 2018-08-21 DIAGNOSIS — J9601 Acute respiratory failure with hypoxia: Secondary | ICD-10-CM | POA: Diagnosis not present

## 2018-08-21 DIAGNOSIS — U071 COVID-19: Secondary | ICD-10-CM | POA: Diagnosis not present

## 2018-08-21 DIAGNOSIS — N189 Chronic kidney disease, unspecified: Secondary | ICD-10-CM | POA: Diagnosis not present

## 2018-08-21 DIAGNOSIS — N179 Acute kidney failure, unspecified: Secondary | ICD-10-CM | POA: Diagnosis not present

## 2018-08-21 DIAGNOSIS — J1289 Other viral pneumonia: Secondary | ICD-10-CM | POA: Diagnosis not present

## 2018-08-22 DIAGNOSIS — I129 Hypertensive chronic kidney disease with stage 1 through stage 4 chronic kidney disease, or unspecified chronic kidney disease: Secondary | ICD-10-CM | POA: Diagnosis not present

## 2018-08-22 DIAGNOSIS — R0902 Hypoxemia: Secondary | ICD-10-CM | POA: Diagnosis not present

## 2018-08-22 DIAGNOSIS — U071 COVID-19: Secondary | ICD-10-CM | POA: Diagnosis not present

## 2018-08-22 DIAGNOSIS — N189 Chronic kidney disease, unspecified: Secondary | ICD-10-CM | POA: Diagnosis not present

## 2018-08-22 DIAGNOSIS — J9601 Acute respiratory failure with hypoxia: Secondary | ICD-10-CM | POA: Diagnosis not present

## 2018-08-23 DIAGNOSIS — U071 COVID-19: Secondary | ICD-10-CM | POA: Diagnosis not present

## 2018-08-23 DIAGNOSIS — J9601 Acute respiratory failure with hypoxia: Secondary | ICD-10-CM | POA: Diagnosis not present

## 2018-08-23 DIAGNOSIS — J1289 Other viral pneumonia: Secondary | ICD-10-CM | POA: Diagnosis not present

## 2018-08-23 DIAGNOSIS — I129 Hypertensive chronic kidney disease with stage 1 through stage 4 chronic kidney disease, or unspecified chronic kidney disease: Secondary | ICD-10-CM | POA: Diagnosis not present

## 2018-08-24 DIAGNOSIS — J9601 Acute respiratory failure with hypoxia: Secondary | ICD-10-CM | POA: Diagnosis not present

## 2018-08-24 DIAGNOSIS — J1289 Other viral pneumonia: Secondary | ICD-10-CM | POA: Diagnosis not present

## 2018-08-24 DIAGNOSIS — I129 Hypertensive chronic kidney disease with stage 1 through stage 4 chronic kidney disease, or unspecified chronic kidney disease: Secondary | ICD-10-CM | POA: Diagnosis not present

## 2018-08-24 DIAGNOSIS — U071 COVID-19: Secondary | ICD-10-CM | POA: Diagnosis not present

## 2018-08-25 DIAGNOSIS — R279 Unspecified lack of coordination: Secondary | ICD-10-CM | POA: Diagnosis not present

## 2018-08-25 DIAGNOSIS — J1289 Other viral pneumonia: Secondary | ICD-10-CM | POA: Diagnosis not present

## 2018-08-25 DIAGNOSIS — M6281 Muscle weakness (generalized): Secondary | ICD-10-CM | POA: Diagnosis not present

## 2018-08-25 DIAGNOSIS — B9729 Other coronavirus as the cause of diseases classified elsewhere: Secondary | ICD-10-CM | POA: Diagnosis not present

## 2018-08-25 DIAGNOSIS — U071 COVID-19: Secondary | ICD-10-CM | POA: Diagnosis not present

## 2018-08-25 DIAGNOSIS — I10 Essential (primary) hypertension: Secondary | ICD-10-CM | POA: Diagnosis not present

## 2018-08-25 DIAGNOSIS — R5381 Other malaise: Secondary | ICD-10-CM | POA: Diagnosis not present

## 2018-08-25 DIAGNOSIS — J9601 Acute respiratory failure with hypoxia: Secondary | ICD-10-CM | POA: Diagnosis not present

## 2018-09-04 DIAGNOSIS — I10 Essential (primary) hypertension: Secondary | ICD-10-CM | POA: Diagnosis not present

## 2018-09-04 DIAGNOSIS — Z6841 Body Mass Index (BMI) 40.0 and over, adult: Secondary | ICD-10-CM | POA: Diagnosis not present

## 2018-09-04 DIAGNOSIS — I5020 Unspecified systolic (congestive) heart failure: Secondary | ICD-10-CM | POA: Diagnosis not present

## 2018-09-04 DIAGNOSIS — I4891 Unspecified atrial fibrillation: Secondary | ICD-10-CM | POA: Diagnosis not present

## 2018-11-12 DIAGNOSIS — I5020 Unspecified systolic (congestive) heart failure: Secondary | ICD-10-CM | POA: Diagnosis not present

## 2018-11-12 DIAGNOSIS — I4891 Unspecified atrial fibrillation: Secondary | ICD-10-CM | POA: Diagnosis not present

## 2018-11-12 DIAGNOSIS — I10 Essential (primary) hypertension: Secondary | ICD-10-CM | POA: Diagnosis not present

## 2018-11-12 DIAGNOSIS — Z6841 Body Mass Index (BMI) 40.0 and over, adult: Secondary | ICD-10-CM | POA: Diagnosis not present

## 2018-12-14 DIAGNOSIS — Z0000 Encounter for general adult medical examination without abnormal findings: Secondary | ICD-10-CM | POA: Diagnosis not present

## 2018-12-14 DIAGNOSIS — Z6841 Body Mass Index (BMI) 40.0 and over, adult: Secondary | ICD-10-CM | POA: Diagnosis not present

## 2021-01-11 DIAGNOSIS — J4540 Moderate persistent asthma, uncomplicated: Secondary | ICD-10-CM | POA: Diagnosis not present

## 2021-01-11 DIAGNOSIS — N182 Chronic kidney disease, stage 2 (mild): Secondary | ICD-10-CM | POA: Diagnosis not present

## 2021-01-11 DIAGNOSIS — I4891 Unspecified atrial fibrillation: Secondary | ICD-10-CM | POA: Diagnosis not present

## 2021-01-11 DIAGNOSIS — I10 Essential (primary) hypertension: Secondary | ICD-10-CM | POA: Diagnosis not present

## 2021-01-11 DIAGNOSIS — I5020 Unspecified systolic (congestive) heart failure: Secondary | ICD-10-CM | POA: Diagnosis not present

## 2021-01-11 DIAGNOSIS — Z6841 Body Mass Index (BMI) 40.0 and over, adult: Secondary | ICD-10-CM | POA: Diagnosis not present

## 2023-11-15 ENCOUNTER — Telehealth: Payer: Self-pay | Admitting: Internal Medicine

## 2023-11-15 NOTE — Telephone Encounter (Signed)
 Copied from CRM #[id number]. Topic: Clinical - Medical Advice >> Nov 15, 2023 10:07 AM [name] wrote: Reason for CRM: pt calling to follow up on a referral sent to Dr Darlean from [facility]. [facility] states they faxed to dr wert in the past few days..  They need info filled out on why pt is needing to be seen.  Pt is asking if you can confirm you have received this information from [facility], and is there anything else he needs to do? Cb  [phone]  Spoke with patient and informed himwe have received the Physician's Statement forms fro [facility], but we are unable to complete these forms until patient is seen on 11/30/23.  He voiced his understanding

## 2023-11-20 NOTE — Telephone Encounter (Unsigned)
 Copied from CRM #[id number]. Topic: General - Other >> Nov 20, 2023  2:54 PM Shawn Duke wrote: Reason for CRM: Patient states that short term disability papers have been faxed to [facility] in [place] but is calling to update Dr. Darlean that the paperwork needs to include the upcoming appointment date information on 8/28 and an attendance statement.

## 2023-11-30 ENCOUNTER — Encounter: Payer: Self-pay | Admitting: Internal Medicine

## 2023-11-30 ENCOUNTER — Ambulatory Visit: Admitting: Internal Medicine

## 2023-11-30 ENCOUNTER — Telehealth: Payer: Self-pay | Admitting: Internal Medicine

## 2023-11-30 VITALS — BP 133/81 | HR 88 | Ht 62.0 in | Wt 255.4 lb

## 2023-11-30 DIAGNOSIS — J9611 Chronic respiratory failure with hypoxia: Secondary | ICD-10-CM

## 2023-11-30 DIAGNOSIS — Z6841 Body Mass Index (BMI) 40.0 and over, adult: Secondary | ICD-10-CM | POA: Diagnosis not present

## 2023-11-30 DIAGNOSIS — R0609 Other forms of dyspnea: Secondary | ICD-10-CM

## 2023-11-30 DIAGNOSIS — R911 Solitary pulmonary nodule: Secondary | ICD-10-CM

## 2023-11-30 NOTE — Progress Notes (Signed)
 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    S/p admit to [facility] Patient admitted on: 10/24/2023    CHIEF COMPLAINT: Dyspnea Day of admission HPI:  Patient presented to ED w/ c/o dyspnea. States that it started the day before while he was at work. Felt very warm and difficult to breathe. Denied any cough or other URI symptoms. Did take his albuterol  w/o much improvement. Denied using any controller inhaler currently. Denies tobacco/vaping and not exposed to any second hand. His job involves being around dust but no chemical inhalants. States that his PCP changed him recently to Labetalol for BP and has felt his resp symptoms are worse.    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 CTA negative for infiltrates or PE but noted underlying emphysematous disease. Start on scheduled Duonebs w/ systemic steroids. Will hold off on empiric abx given lack of cardinal symptoms. Discussed that would recommend starting on controller and initiated Breo. Will need this rx'd at discharge. HTN - BP within acceptable range. Advised that would stop his Labetalol given non-selectiveness and potential worsening of airway disease. Resume previous Chlorthalidone and Losartan. DM - Resume his home 70/30. Accuchecks and SSI to cover in event he develops steroid induced hyperglycemia. 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  10/24/2023 => POC glucose in good range this morning between 180s to 190s CMP => potassium low at 3.3 we will orally  replete; creatinine elevated at 1.44 CBC => white count within normal limits at 10.7; hemoglobin within normal limits at 12.3; ABG done at 10/24/2023 at 4 AM => shows PaO2 low at 56, CO2 normal at 39.8  CTA chest with contrast => Negative for PE Heterogeneous areas of groundglass attenuation and lucency throughout both lung favored to represent areas of atelectasis and air trapping 3: Subsolid nodular 12 mm density in the left lower lobe. If not previously evaluated, CT follow-up in 6 months is recommended  Note: Patient does not have oxygen usage at home but is on 2 L here No empiric antibiotic therapy was required based on today's presentation  Discharge plan: => Prescription for [facility] Prescription for chlorthalidone and losartan Prescription for resume 70/30 Prescription for: Stop labetalol secondary to potential for worsening airway disease and use chlorthalidone and losartan instead Follow-up with PCP, Dr Orpha in the next 2 to 3 days for continuity of care   Patient discharged on Home O2? - yes    rx Prednisone  20 mg every day x 5 days. Dispense 5 pills. Take until all gone.  We did a walk test with respiratory therapist and the test showed you did need oxygen with any kind of exertion. Case management is working on getting you the oxygen equipment.  You also need CPAP equipment but one of the companies we use said you had an unpaid bill with them and they will not give you a  machine until bill is paid.  I am giving you a work note to be out of work yesterday today and tomorrow, you can return to work on 26 October 2023 .       History of Present Illness  11/30/2023  Pulmonary/ 1st office eval/ [name] / [place] [facility]  maint on BREO/02  Chief Complaint  Patient presents with   Establish Care    Shob   Dyspnea:  much better on 02 2lpm  Cough: none Sleep: cpap x 2025 and now plugs in 2lpm  SABA use: none  02: as above - does not use  No obvious day to day or daytime  pattern/variability or assoc excess/ purulent sputum or mucus plugs or hemoptysis or cp or chest tightness, subjective wheeze or overt sinus or hb symptoms.    Also denies any obvious fluctuation of symptoms with weather or environmental changes or other aggravating or alleviating factors except as outlined above   No unusual exposure hx or h/o childhood pna/ asthma or knowledge of premature birth.  Current Allergies, Complete Past Medical History, Past Surgical History, Family History, and Social History were reviewed in [employer] record.  ROS  The following are not active complaints unless bolded Hoarseness, sore throat, dysphagia, dental problems, itching, sneezing,  nasal congestion or discharge of excess mucus or purulent secretions, ear ache,   fever, chills, sweats, unintended wt loss or wt gain, classically pleuritic or exertional cp,  orthopnea pnd or arm/hand swelling  or leg swelling, presyncope, palpitations, abdominal pain, anorexia, nausea, vomiting, diarrhea  or change in bowel habits or change in bladder habits, change in stools or change in urine, dysuria, hematuria,  rash, arthralgias, visual complaints, headache, numbness, weakness or ataxia or problems with walking or coordination,  change in mood or  memory.            Outpatient Medications Prior to Visit  Medication Sig Dispense Refill   chlorthalidone (HYGROTON) 25 MG tablet Take 75 mg by mouth 3 (three) times daily.     fluticasone furoate-vilanterol (BREO ELLIPTA) 100-25 MCG/ACT AEPB Inhale 1 puff into the lungs 2 (two) times daily.     hydrOXYzine (ATARAX) 25 MG tablet Take 25 mg by mouth daily.     insulin regular (NOVOLIN R) 100 units/mL injection Inject into the skin 3 (three) times daily before meals. (Patient taking differently: Inject into the skin 2 (two) times daily before a meal.)     potassium chloride (KLOR-CON M) 10 MEQ tablet Take 10 mEq by mouth daily.     hydrochlorothiazide   (HYDRODIURIL ) 25 MG tablet Take 1 tablet (25 mg total) by mouth daily. (Patient not taking: Reported on 11/30/2023) 30 tablet 0   No facility-administered medications prior to visit.    Past Medical History:  Diagnosis Date   Hypertension       Objective:     BP 133/81   Pulse 88   Ht 5' 2 (1.575 m)   Wt 255 lb 6.4 oz (115.8 kg)   SpO2 97% Comment: 2 l cont  BMI 46.71 kg/m   SpO2: 97 % (2 l cont) very pleasant amb bm nad    HEENT : Oropharynx  clear      Nasal turbinates nl    NECK :  without  apparent JVD/ palpable Nodes/TM    LUNGS: no acc muscle use,  Nl contour chest which is clear to A and P bilaterally without cough on insp or exp maneuvers   CV:  RRR  no s3 or murmur or increase in P2, and no edema   ABD:  soft and nontender   MS:  Gait nl   ext warm without deformities Or obvious joint restrictions  calf tenderness, cyanosis or clubbing    SKIN: warm and dry without lesions    NEURO:  alert, approp, nl sensorium with  no motor or cerebellar deficits apparent.    I personally reviewed images and agree with radiology impression as follows:  CXR:   portable   10/24/23  No airspace consolidation or focal infiltrate. There is no visible effusion. Mediastinum is unremarkable. Pulmonary vasculature appears normal.       I personally reviewed images and agree with radiology impression as follows:   Chest CT w contrast  10/24/23  1.    Negative for pulmonary embolism.   2.    Heterogeneous areas of groundglass attenuation and lucency throughout both lungs favored to represent areas of atelectasis and air trapping.   3.    Sub-solid nodular 12 mm density in the left lower lobe. If not previously evaluated, CT follow-up in 6 months is recommended, per Fleischner Society guidelines.       Assessment   Assessment & Plan DOE (dyspnea on exertion) Born prematurely at wt 1 lb x 1 year in hosp - worse summer 2025 > see admit to [facility] 10/24/23 with ? Copd exac >  rx Breo 100 each am - PFTs ordered 11/30/2023   Copd of prematurity occurs due to lack of lungs to reach their normal size and here the problem is also wt gain and ? AB component > continue breo pending pfts ? Needs trelegy if indeed does have copd.    Chronic respiratory failure with hypoxia (HCC) Born prematurely ? 1 lb x one year in hosp and on 02 until age 25  - placed on 02 2lpm on 10/24/23 (see admit)  - 11/30/2023   Walked on 2lpm cont  x  3  lap(s) =  approx 450  ft  @ fast pace, stopped due to end of study  with lowest 02 sats 92% min sob    Much better on BREO and 02 2lpm 24/7 but tells me he is practically unable to do his job from a practical persepective based on his constant movement and exp to open flames at work.  He could do deskwork therefore but has not been offered a deskjob and doesn't feel he's likely to be able to get one but is perfect for vocational rehab.   Will fill outpaperwork for disability with  the above caveat.   Morbid obesity due to excess calories (HCC) Body mass index is 46.71 kg/m.    No results found for: TSH    Contributing to doe and OSA  and risk of GERD/dvt/PE  >>>   reviewed the need and the process to achieve and maintain neg calorie balance > defer f/u primary care including intermittently monitoring thyroid  status      Solitary pulmonary nodule on lung CT Never smoker - CT 10/24/23 Sub-solid nodular 12 mm density in the left lower lobe.   >>> rec f/u 04/26/23 non contrasted CT ordered    Discussed in detail all the  indications, usual  risks and alternatives  relative to the benefits with patient who agrees to proceed with w/u as outlined.             Each maintenance medication was reviewed in detail including emphasizing most importantly the difference  between maintenance and prns and under what circumstances the prns are to be triggered using an action plan format where appropriate.  Total time for H and P, chart review,  counseling, reviewing 02/pulse ox/dpi device(s) and generating customized AVS unique to this office visit / same day charting = 61 min new pt eval        AVS  Patient Instructions  Plan A = Automatic = Always=    BREO one click each am  - tug on it hard to get it started and breath all the way in, holding breath for at least 5 seconds  - rinse mouth with water afterwards  Plan B = Backup (to supplement plan A, not to replace it) Use your albuterol  inhaler as a rescue medication to be used if you can't catch your breath by resting or slowing your pace  or doing a relaxed purse lip breathing pattern.  - The less you use it, the better it will work when you need it. - Ok to use the inhaler up to 2 puffs  every 4 hours if you must but call for appointment if use goes up over your usual need - Don't leave home without it !!  (think of it like the spare tire or starter fluid for your car)     Also  Ok to try albuterol  15 min before an activity (on alternating days)  that you know would usually make you short of breath and see if it makes any difference and if makes none then don't take albuterol  after activity unless you can't catch your breath as this means it's the resting that helps, not the albuterol .   Please schedule a follow up office visit in 6 weeks, call sooner if needed with PFTs on return            Ozell America, MD 12/02/2023

## 2023-11-30 NOTE — Patient Instructions (Signed)
 Plan A = Automatic = Always=    BREO one click each am  - tug on it hard to get it started and breath all the way in, holding breath for at least 5 seconds  - rinse mouth with water afterwards  Plan B = Backup (to supplement plan A, not to replace it) Use your albuterol  inhaler as a rescue medication to be used if you can't catch your breath by resting or slowing your pace  or doing a relaxed purse lip breathing pattern.  - The less you use it, the better it will work when you need it. - Ok to use the inhaler up to 2 puffs  every 4 hours if you must but call for appointment if use goes up over your usual need - Don't leave home without it !!  (think of it like the spare tire or starter fluid for your car)     Also  Ok to try albuterol  15 min before an activity (on alternating days)  that you know would usually make you short of breath and see if it makes any difference and if makes none then don't take albuterol  after activity unless you can't catch your breath as this means it's the resting that helps, not the albuterol .   Please schedule a follow up office visit in 6 weeks, call sooner if needed with PFTs on return

## 2023-11-30 NOTE — Telephone Encounter (Signed)
 Spoke with patient regarding the 02/08/24 8:30 am PFT appointment at [facility] time is 8:15am, 1st floor registration desk for check in--follow up with D.r Darlean is at 9:45 am.  Will mail information to patient

## 2023-12-01 ENCOUNTER — Telehealth: Payer: Self-pay | Admitting: Internal Medicine

## 2023-12-01 NOTE — Telephone Encounter (Signed)
 Attending Physician Statement received from [facility]

## 2023-12-01 NOTE — Telephone Encounter (Signed)
 Gave papers to Dr wert to fill out

## 2023-12-02 DIAGNOSIS — R911 Solitary pulmonary nodule: Secondary | ICD-10-CM | POA: Insufficient documentation

## 2023-12-02 NOTE — Assessment & Plan Note (Addendum)
 Body mass index is 46.71 kg/m.    No results found for: TSH    Contributing to doe and OSA  and risk of GERD/dvt/PE  >>>   reviewed the need and the process to achieve and maintain neg calorie balance > defer f/u primary care including intermittently monitoring thyroid  status

## 2023-12-02 NOTE — Assessment & Plan Note (Addendum)
 Born prematurely ? 1 lb x one year in hosp and on 02 until age 38  - placed on 02 2lpm on 10/24/23 (see admit)  - 11/30/2023   Walked on 2lpm cont  x  3  lap(s) =  approx 450  ft  @ fast pace, stopped due to end of study  with lowest 02 sats 92% min sob    Much better on BREO and 02 2lpm 24/7 but tells me he is practically unable to do his job from a practical persepective based on his constant movement and exp to open flames at work.  He could do deskwork therefore but has not been offered a deskjob and doesn't feel he's likely to be able to get one but is perfect for vocational rehab.   Will fill outpaperwork for disability with  the above caveat.

## 2023-12-02 NOTE — Assessment & Plan Note (Addendum)
 Never smoker - CT 10/24/23 Sub-solid nodular 12 mm density in the left lower lobe.   >>> rec f/u 04/26/23 non contrasted CT ordered

## 2023-12-02 NOTE — Assessment & Plan Note (Addendum)
 Born prematurely at wt 1 lb x 1 year in hosp - worse summer 2025 > see admit to [facility] 10/24/23 with ? Copd exac > rx Breo 100 each am - PFTs ordered 11/30/2023   Copd of prematurity occurs due to lack of lungs to reach their normal size and here the problem is also wt gain and ? AB component > continue breo pending pfts ? Needs trelegy if indeed does have copd.

## 2023-12-05 NOTE — Telephone Encounter (Addendum)
 Pt would like to know if Dr Darlean sent his FMLA paper's to [facility] yet?  And he would like a call back to et him know [phone]

## 2023-12-05 NOTE — Telephone Encounter (Signed)
 Pt would like to know if Dr Darlean sent his FMLA paper's to [facility] yet?     Please advise

## 2023-12-05 NOTE — Telephone Encounter (Signed)
 Relayed dr leisa irby

## 2023-12-20 ENCOUNTER — Telehealth: Payer: Self-pay | Admitting: *Deleted

## 2023-12-20 NOTE — Telephone Encounter (Signed)
 Advised patient per previous message about FMLA paperwork. MW filled out to the best of his knowledge after last ov. Patient is scheduled for PFT in Nov at [facility]. Explained his f/u was to be in 6 weeks and he could have test done in [place], patient declined [facility]/c he lives in [place]. NFN  Wert, Shawn B, MD to [name] [name], [place]     12/05/23 12:31 PM Done- you may let him know they will likely need a whole lot more info once I finish his work up that I could not provide today

## 2023-12-27 NOTE — Telephone Encounter (Signed)
 Patient needs for Dr. Darlean to put a return to work date on his FMLA/Disability paperwork and resend it to [employer].  [place] received it but there was no return to work date on the previous fax.  Please let patient know when this has been done.  Thanks.

## 2023-12-27 NOTE — Telephone Encounter (Signed)
 Shawn Duke, Where would this paperwork be here in [place]?  Does Dr. Darlean already have the paperwork to addend?  Thank you.

## 2023-12-28 ENCOUNTER — Telehealth: Payer: Self-pay | Admitting: Internal Medicine

## 2023-12-28 NOTE — Telephone Encounter (Signed)
 Copied from CRM #[id number]. Topic: Clinical - Medical Advice >> Dec 27, 2023 11:12 AM [name] wrote: : Reason for CRM: pt states [employer] sent back some papers askinf for a RTW date.  Pt uld lke to know thedae Dr ert put on I or os ot underminuedwpi please call [phone] >> Dec 28, 2023  8:49 AM Leila C wrote: Patient is checking on the status on shortness term disability from [employer] for Dr. Darlean to sign. Please call [phone].  >> Dec 27, 2023  4:19 PM Leila BROCKS wrote: Patient [phone] states called earlier today and is checking on short term disability forms from [facility] for Dr. Darlean to sign on patient's return to work date. Patient states is unsure of the deadline, patient has not been paid since this issues. Please advise and call back.   Called patient and  informed him we have not received any paperwork from [employer]---he states he will call and have them refax

## 2023-12-28 NOTE — Telephone Encounter (Signed)
 Patient walked in to let Dr. Darlean know that he cannot work while using oxygen

## 2023-12-28 NOTE — Telephone Encounter (Signed)
 Additional information request received from [employer] and taken to nursing station for completion

## 2023-12-29 NOTE — Telephone Encounter (Signed)
 Dr Darlean, please let us  know once his forms are completed. Thanks!

## 2024-01-01 NOTE — Telephone Encounter (Signed)
 Copied from CRM [id number]. Topic: General - Other >> Jan 01, 2024 10:12 AM Isabell A wrote: Reason for CRM: Patient states for [employer] short term disability paperwork, they're requesting his office visit notes as well to be faxed.  Office notes faxed to [facility] and confirmation received

## 2024-01-01 NOTE — Telephone Encounter (Signed)
 Copied from CRM #[id number]. Topic: General - Other >> Jan 01, 2024  4:06 PM [name] wrote: Reason for CRM: Patient is calling to request the details of his disability paperwork be provided to him. He would like to know the return to work date, etc. Please call patient back.  LVM for patient to return our call

## 2024-01-01 NOTE — Telephone Encounter (Signed)
 Copied from CRM #[id number]. Topic: Clinical - Medical Advice >> Dec 27, 2023 11:12 AM [name] wrote: : Reason for CRM: pt states [employer] sent back some papers askinf for a RTW date.  Pt uld lke to know thedae Dr ert put on I or os ot underminuedwpi please call [phone] >> Dec 29, 2023  4:02 PM Russell PARAS wrote: Pt contacting clinic to verify if forms were completed and sent to [facility]. Advised they had not been sent over as of yet.  >> Dec 29, 2023  2:49 PM Russell PARAS wrote: Pt is contacting clinic concerning his short term disability paperwork. He was advised by [facility] they are needing the medical information release form, along with the office notes and Attending Physician Statement(with RTW date) sent to them.  >> Dec 28, 2023  8:49 AM Chantha C wrote: Patient is checking on the status on shortness term disability from [employer] for Dr. Darlean to sign. Please call [phone].  >> Dec 27, 2023  4:19 PM Leila BROCKS wrote: Patient [phone] states called earlier today and is checking on short term disability forms from [employer] for Dr. Darlean to sign on patient's return to work date. Patient states is unsure of the deadline, patient has not been paid since this issues. Please advise and call back.   LVM for patient stating we did fax his paperwork to the [facility] on 12/28/23 at 4:17pm and confirmation was received----return call requested with questions or concerns

## 2024-01-02 ENCOUNTER — Telehealth: Payer: Self-pay | Admitting: Internal Medicine

## 2024-01-02 NOTE — Telephone Encounter (Signed)
 Pt called and is just asking for a return to work date Pt states that [employer] informed him that he needs that date by today or his case will be closed - I informed pt that he does have to complete all work up directed by you on LOV papers and he does has appt. scheduled and understood that but requesting a date for [place] by today .

## 2024-01-02 NOTE — Telephone Encounter (Signed)
 Called patient to inform him we have rescheduled the PFT at [facility] to 01/25/24 at 3:00pm---asked patient if he would be willing to go to [facility] for a sooner appointment and he declined.  He stated he lives in [place] and he would keep the appointment at [facility]

## 2024-01-03 NOTE — Telephone Encounter (Signed)
 ERROR

## 2024-01-04 NOTE — Telephone Encounter (Unsigned)
 Copied from CRM #[id number]. Topic: General - Other >> Jan 01, 2024  4:06 PM [name] wrote: Reason for CRM: Patient is calling to request the details of his disability paperwork be provided to him. He would like to know the return to work date, etc. Please call patient back. >> Jan 04, 2024  8:51 AM Leila BROCKS wrote: Patient [phone] states forgot what the form is called it's for short term disability, and needs it as soon as possible. Per CAL, already informed patient Dr. Darlean is not in the office this week and the nurse has messaged him in Mychart; send crm to admin. Patient asked for the forms to be given to him. Please advise and call back.

## 2024-01-09 NOTE — Telephone Encounter (Signed)
 Dr wert signed his return to work date and faxed back 10/7

## 2024-01-25 ENCOUNTER — Ambulatory Visit (HOSPITAL_COMMUNITY)
Admission: RE | Admit: 2024-01-25 | Discharge: 2024-01-25 | Disposition: A | Discharge status: Home / Self Care | Source: Ambulatory Visit | Attending: Internal Medicine | Admitting: Internal Medicine

## 2024-01-25 DIAGNOSIS — R0609 Other forms of dyspnea: Secondary | ICD-10-CM | POA: Diagnosis present

## 2024-01-25 LAB — PULMONARY FUNCTION TEST
DL/VA % pred: 72 %
DL/VA: 3.59 ml/min/mmHg/L
DLCO unc % pred: 67 %
DLCO unc: 14.7 ml/min/mmHg
FEF 25-75 Post: 0.58 L/s
FEF 25-75 Pre: 0.5 L/s
FEF2575-%Change-Post: 17 %
FEF2575-%Pred-Post: 18 %
FEF2575-%Pred-Pre: 16 %
FEV1-%Change-Post: 4 %
FEV1-%Pred-Post: 45 %
FEV1-%Pred-Pre: 43 %
FEV1-Post: 1.35 L
FEV1-Pre: 1.29 L
FEV1FVC-%Change-Post: -2 %
FEV1FVC-%Pred-Pre: 62 %
FEV6-%Change-Post: 4 %
FEV6-%Pred-Post: 70 %
FEV6-%Pred-Pre: 67 %
FEV6-Post: 2.47 L
FEV6-Pre: 2.37 L
FEV6FVC-%Change-Post: -2 %
FEV6FVC-%Pred-Post: 91 %
FEV6FVC-%Pred-Pre: 93 %
FVC-%Change-Post: 6 %
FVC-%Pred-Post: 76 %
FVC-%Pred-Pre: 71 %
FVC-Post: 2.76 L
FVC-Pre: 2.59 L
Post FEV1/FVC ratio: 49 %
Post FEV6/FVC ratio: 90 %
Pre FEV1/FVC ratio: 50 %
Pre FEV6/FVC Ratio: 92 %
RV % pred: 348 %
RV: 4.19 L
TLC % pred: 145 %
TLC: 6.87 L

## 2024-01-25 MED ORDER — ALBUTEROL SULFATE (2.5 MG/3ML) 0.083% IN NEBU
2.5000 mg | INHALATION_SOLUTION | Freq: Once | RESPIRATORY_TRACT | Status: AC
  Administered 2024-01-25: 2.5 mg via RESPIRATORY_TRACT

## 2024-01-27 ENCOUNTER — Ambulatory Visit: Payer: Self-pay | Admitting: Internal Medicine

## 2024-01-30 ENCOUNTER — Other Ambulatory Visit: Payer: Self-pay

## 2024-01-30 MED ORDER — UMECLIDINIUM-VILANTEROL 62.5-25 MCG/ACT IN AEPB: 1.0000 | INHALATION_SPRAY | Freq: Every day | RESPIRATORY_TRACT | 3 refills | Status: AC

## 2024-01-30 NOTE — Progress Notes (Signed)
 Atc x1 lmtcb - sent rx to pharm

## 2024-02-08 ENCOUNTER — Ambulatory Visit: Admitting: Internal Medicine

## 2024-02-08 ENCOUNTER — Encounter: Payer: Self-pay | Admitting: Internal Medicine

## 2024-02-08 ENCOUNTER — Encounter (HOSPITAL_COMMUNITY)

## 2024-02-08 VITALS — BP 129/82 | HR 95 | Ht 62.0 in | Wt 257.0 lb

## 2024-02-08 DIAGNOSIS — R911 Solitary pulmonary nodule: Secondary | ICD-10-CM

## 2024-02-08 DIAGNOSIS — R0609 Other forms of dyspnea: Secondary | ICD-10-CM

## 2024-02-08 DIAGNOSIS — J9611 Chronic respiratory failure with hypoxia: Secondary | ICD-10-CM | POA: Diagnosis not present

## 2024-02-08 DIAGNOSIS — Z6841 Body Mass Index (BMI) 40.0 and over, adult: Secondary | ICD-10-CM

## 2024-02-08 DIAGNOSIS — J449 Chronic obstructive pulmonary disease, unspecified: Secondary | ICD-10-CM

## 2024-02-08 MED ORDER — ALBUTEROL SULFATE HFA 108 (90 BASE) MCG/ACT IN AERS
INHALATION_SPRAY | RESPIRATORY_TRACT | 1 refills | Status: AC
Start: 2024-02-08 — End: ?

## 2024-02-08 NOTE — Progress Notes (Unsigned)
 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 States that it started the day before while he was at work. Felt very warm and difficult to breathe. Denied any cough or other URI symptoms. Did take his albuterol  w/o much improvement. Denied using any controller inhaler currently. Denies tobacco/vaping and not exposed to any second hand. His job involves being around dust but no chemical inhalants. States that his PCP changed him recently to Labetalol for BP and has felt his resp symptoms are worse.    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 CTA negative for infiltrates or PE but noted underlying emphysematous disease. Start on scheduled Duonebs w/ systemic steroids. Will hold off on empiric abx given lack of cardinal symptoms. Discussed that would recommend starting on controller and initiated Breo. Will need this rx'd at discharge. HTN - BP within acceptable range. Advised that would stop his Labetalol given non-selectiveness and potential worsening of airway disease. Resume previous Chlorthalidone and Losartan. DM - Resume his home 70/30. Accuchecks and SSI to cover in event he develops steroid induced hyperglycemia. 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  10/24/2023 => POC glucose in good range this morning between  180s to 190s CMP => potassium low at 3.3 we will orally replete; creatinine elevated at 1.44 CBC => white count within normal limits at 10.7; hemoglobin within normal limits at 12.3; ABG done at 10/24/2023 at 4 AM => shows PaO2 low at 56, CO2 normal at 39.8  CTA chest with contrast => Negative for PE Heterogeneous areas of groundglass attenuation and lucency throughout both lung favored to represent areas of atelectasis and air trapping 3: Subsolid nodular 12 mm density in the left lower lobe. If not previously evaluated, CT follow-up in 6 months is recommended  Note: Patient does not have oxygen usage at home but is on 2 L here No empiric antibiotic therapy was required based on today's presentation  Discharge plan: => Prescription for [facility] Prescription for chlorthalidone and losartan Prescription for resume 70/30 Prescription for: Stop labetalol secondary to potential for worsening airway disease and use chlorthalidone and losartan instead Follow-up with PCP, Dr Orpha in the next 2 to 3 days for continuity of care   Patient discharged on Home O2? - yes    rx Prednisone  20 mg every day x 5 days. Dispense 5 pills. Take until all gone. We did a walk test with respiratory therapist and the test showed you did need oxygen with any kind of exertion. Case management is working on getting you the oxygen equipment. You also need CPAP equipment but one of the companies we use said you had an  unpaid bill with them and they will not give you a machine until bill is paid. I am giving you a work note to be out of work yesterday today and tomorrow, you can return to work on 26 October 2023 .       History of Present Illness  11/30/2023  Pulmonary/ 1st office eval/ Shawn Duke / [place] [facility]  maint on BREO/02  Chief Complaint  Patient presents with   Establish Care    Shob   Dyspnea:  much better on 02 2lpm  Cough: none Sleep: cpap x 2025 and now plugs in 2lpm  SABA use: none  02: as above -  does not use Patient Instructions  Plan A = Automatic = Always=    BREO one click each am  - tug on it hard to get it started and breath all the way in, holding breath for at least 5 seconds  - rinse mouth with water afterwards Plan B = Backup (to supplement plan A, not to replace it) Use your albuterol  inhaler as a rescue medication   Ok to try albuterol  15 min before an activity (on alternating days)  that you know would usually make you short of breath Please schedule a follow up office visit in 6 weeks, call sooner if needed     02/08/2024  f/u ov/[place] office/Ezella Kell re: doe/ chronic resp failure maint on Anoro  for GOLD 3 copd from prematurity  Chief Complaint  Patient presents with   Shortness of Breath    Pft 10/23    Dyspnea:  even with 02 on struggles to get to [facility] parking spot / 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    Also denies any obvious fluctuation of symptoms with weather or environmental changes or other aggravating or alleviating factors except as outlined above   No unusual exposure hx or h/o childhood pna/ asthma or knowledge of premature birth.  Current Allergies, Complete Past Medical History, Past Surgical History, Family History, and Social History were reviewed in [employer] record.  ROS  The following are not active complaints unless bolded Hoarseness, sore throat, dysphagia, dental problems, itching, sneezing,  nasal congestion or discharge of excess mucus or purulent secretions, ear ache,   fever, chills, sweats, unintended wt loss or wt gain, classically pleuritic or exertional cp,  orthopnea pnd or arm/hand  swelling  or leg swelling, presyncope, palpitations, abdominal pain, anorexia, nausea, vomiting, diarrhea  or change in bowel habits or change in bladder habits, change in stools or change in urine, dysuria, hematuria,  rash, arthralgias, visual complaints, headache, numbness, weakness or ataxia or problems with walking or coordination,  change in mood or  memory.        Current Meds  Medication Sig   albuterol  (VENTOLIN  HFA) 108 (90 Base) MCG/ACT inhaler Inhale 2 puffs into the lungs.   chlorthalidone (HYGROTON) 25 MG tablet Take 75 mg by mouth 3 (three) times daily.   fluticasone furoate-vilanterol (BREO ELLIPTA) 100-25 MCG/ACT AEPB Inhale 1 puff into the lungs 2 (two) times daily.   HUMULIN 70/30 (70-30) 100 UNIT/ML injection INJECT 30 UNITS  UNDER THE SKIN IN THE MORNING AND 15 UNITS AT night   hydrOXYzine (ATARAX) 25 MG tablet Take 25 mg by mouth daily.   losartan (COZAAR) 100 MG tablet Take 100 mg by mouth daily.   potassium chloride (KLOR-CON M) 10 MEQ tablet Take 10 mEq by mouth daily.   umeclidinium-vilanterol (ANORO ELLIPTA) 62.5-25 MCG/ACT AEPB Inhale 1 puff into the lungs daily.             Past Medical History:  Diagnosis Date   Hypertension       Objective:    with Wt Readings from Last 3 Encounters:  02/08/24 257 lb (116.6 kg)  11/30/23 255 lb 6.4 oz (115.8 kg)    Vital signs reviewed  02/08/2024  - Note at rest 02 sats  97% on 2lpm    General appearance:    MO (by bmi)pleasant amb  bm nad     HEENT :  Oropharynx  clear   Nasal turbinates nl    NECK :  without JVD/Nodes/TM/ nl carotid upstrokes bilaterally   LUNGS: no acc muscle use,  Mod barrel  contour chest wall with bilateral  Distant bs s audible wheeze and  without cough on insp or exp maneuvers and mod  Hyperresonant  to  percussion bilaterally     CV:  RRR  no s3 or murmur or increase in P2, and no edema   ABD: obese  soft and nontender with pos mid insp Hoover's  in the supine position. No bruits or  organomegaly appreciated, bowel sounds nl  MS:   Ext warm without deformities or   obvious joint restrictions , calf tenderness, cyanosis or clubbing  SKIN: warm and dry without lesions    NEURO:  alert, approp, nl sensorium with  no motor or cerebellar deficits apparent.                 Assessment     Assessment & Plan DOE (dyspnea on exertion)  Chronic respiratory failure with hypoxia (HCC) Born prematurely ? 1 lb x one year in hosp and on 02 until age 54  - placed on 02 2lpm on 10/24/23 (see admit)  - 11/30/2023   Walked on 2lpm cont  x  3  lap(s) =  approx 450  ft  @ fast pace, stopped due to end of study  with lowest 02 sats 92% min sob   - 02/08/2024   Walked on RA  x  3  lap(s) =  approx 450  ft  @ mod to fast  pace, stopped due to end of study  with lowest 02 sats 87% and sob last lap  >>> rec 2lpm with exertion and sleep, no need at rest or slow activities    I don't believe he'll be able to do any kind of work that requires continuous walking or lifting with deskwork his only option at this point.  Solitary pulmonary nodule on lung CT Never smoker - CT 10/24/23 Sub-solid nodular 12 mm density in the left lower lobe.   >>> rec f/u 04/25/24 non contrasted CT   Likely this was inflammatory in this setttinb but advised to keep appt for 6 m f/u CT already on the books  COPD mixed type (HCC) Never smoker/ Born prematurely at wt 1 lb x 1 year in hosp - worse summer 2025 > see admit to [facility] 10/24/23 with ? Copd exac > rx Breo 100 each am/placed on 02 2lpm  - PFT's  01/25/24  FEV1 1.35 (45 % ) ratio 0.49  p 4 % improvement from saba p BREO prior to study with DLCO  14.7 (67%)   and FV curve classic concavity  and ERV 28% at wt 254      - 02/08/2024 rx ANORO  Pt is Group B in terms of symptom/risk and laba/lama therefore appropriate rx at this point >>>  anoro and approp saba   Re SABA :  I spent extra time with pt today reviewing appropriate use of albuterol  for prn use on  exertion with the following points: 1) saba is for relief of sob that does not improve by walking a slower pace or resting but rather if the pt does not improve after trying this first. 2) If the pt is convinced, as many are, that saba helps recover from activity faster then it's easy to tell if this is the case by re-challenging : ie stop, take the inhaler, then p 5 minutes try the exact same activity (intensity of workload) that just caused the symptoms and see if they are substantially diminished or not after saba 3) if there is an activity that reproducibly causes the symptoms, try the saba 15 min before the activity on alternate days   If in fact the saba really does help, then fine to continue to use it prn but advised may need to look closer at the maintenance regimen being used to achieve better control of airways disease with exertion.     Morbid obesity due to excess calories (HCC) Body mass index is 47.01 kg/m.  -  trending up still      Contributing to doe and risk of GERD/dvt/ PE  >>>   reviewed the need and the process to achieve and maintain neg calorie balance > defer f/u primary care including intermittently monitoring thyroid  status     Advised re need for 02 with ex to burn fat and montior at peak ex levels to assure > 90%   F/u q 6 m sooner prn   Each maintenance medication was reviewed in detail including emphasizing most importantly the difference between maintenance and prns and under what circumstances the prns are to be triggered using an action plan format where appropriate.  Total time for H and P, chart review, counseling, reviewing dpi  device(s) , directly observing portions of ambulatory 02 saturation study/ and generating customized AVS unique to this office visit / same day charting = 43 min summary f/u ov                  AVS  Patient Instructions  Stop BREO   Plan A = Automatic = Always=    Anoro one click each am   Work on inhaler technique:   relax and gently blow all the way out then take a vigorous  full deep breath back in,  Hold breath in for at least  5 seconds if you can.  Rinse and gargle with water when done.  If mouth or throat bother you at all,  try brushing teeth/gums/tongue with arm and hammer toothpaste/ make a slurry and gargle and spit out.      Plan B = Backup (to supplement plan A, not to replace it) Use your albuterol  inhaler as a rescue medication to be used if you can't catch your breath by resting or slowing your pace  or doing a relaxed purse lip breathing pattern.  - The less you use it, the better it will work when  you need it. - Ok to use the inhaler up to 2 puffs  every 4 hours if you must but call for appointment if use goes up over your usual need - Don't leave home without it !!  (think of it like the spare tire or starter fluid for your car)    Make sure you check your oxygen saturation  AT  your highest level of activity (not after you stop)   to be sure it stays over 90% and adjust  02 flow upward to maintain this level if needed but remember to turn it back to previous settings when you stop (to conserve your supply).   You are due a follow up CT chest in Jan 2026  - call me if you don't hear from them by Feb 2026    Please schedule a follow up visit in  6  months but call sooner if needed     Ozell America, MD 02/09/2024

## 2024-02-08 NOTE — Patient Instructions (Addendum)
 Stop BREO   Plan A = Automatic = Always=    Anoro one click each am   Work on inhaler technique:  relax and gently blow all the way out then take a vigorous  full deep breath back in,  Hold breath in for at least  5 seconds if you can.  Rinse and gargle with water when done.  If mouth or throat bother you at all,  try brushing teeth/gums/tongue with arm and hammer toothpaste/ make a slurry and gargle and spit out.      Plan B = Backup (to supplement plan A, not to replace it) Use your albuterol  inhaler as a rescue medication to be used if you can't catch your breath by resting or slowing your pace  or doing a relaxed purse lip breathing pattern.  - The less you use it, the better it will work when you need it. - Ok to use the inhaler up to 2 puffs  every 4 hours if you must but call for appointment if use goes up over your usual need - Don't leave home without it !!  (think of it like the spare tire or starter fluid for your car)    Make sure you check your oxygen saturation  AT  your highest level of activity (not after you stop)   to be sure it stays over 90% and adjust  02 flow upward to maintain this level if needed but remember to turn it back to previous settings when you stop (to conserve your supply).   You are due a follow up CT chest in Jan 2026  - call me if you don't hear from them by Feb 2026    Please schedule a follow up visit in  6  months but call sooner if needed

## 2024-02-09 DIAGNOSIS — J449 Chronic obstructive pulmonary disease, unspecified: Secondary | ICD-10-CM | POA: Insufficient documentation

## 2024-02-09 NOTE — Assessment & Plan Note (Addendum)
 Never smoker/ Born prematurely at wt 1 lb x 1 year in hosp - worse summer 2025 > see admit to [facility] 10/24/23 with ? Copd exac > rx Breo 100 each am/placed on 02 2lpm  - PFT's  01/25/24   FEV1 1.35 (45 % ) ratio 0.49  p 4 % improvement from saba p BREO prior to study with DLCO  14.7 (67%)   and FV curve classic concavity  and ERV 28% at wt 254      - 02/08/2024 rx ANORO  Pt is Group B in terms of symptom/risk and laba/lama therefore appropriate rx at this point >>>  anoro and approp saba   Re SABA :  I spent extra time with pt today reviewing appropriate use of albuterol  for prn use on exertion with the following points: 1) saba is for relief of sob that does not improve by walking a slower pace or resting but rather if the pt does not improve after trying this first. 2) If the pt is convinced, as many are, that saba helps recover from activity faster then it's easy to tell if this is the case by re-challenging : ie stop, take the inhaler, then p 5 minutes try the exact same activity (intensity of workload) that just caused the symptoms and see if they are substantially diminished or not after saba 3) if there is an activity that reproducibly causes the symptoms, try the saba 15 min before the activity on alternate days   If in fact the saba really does help, then fine to continue to use it prn but advised may need to look closer at the maintenance regimen being used to achieve better control of airways disease with exertion.

## 2024-02-09 NOTE — Assessment & Plan Note (Addendum)
 Never smoker - CT 10/24/23 Sub-solid nodular 12 mm density in the left lower lobe.   >>> rec f/u 04/25/24 non contrasted CT   Likely this was inflammatory in this setttinb but advised to keep appt for 6 m f/u CT already on the books

## 2024-02-09 NOTE — Assessment & Plan Note (Addendum)
 Body mass index is 47.01 kg/m.  -  trending up still      Contributing to doe and risk of GERD/dvt/ PE  >>>   reviewed the need and the process to achieve and maintain neg calorie balance > defer f/u primary care including intermittently monitoring thyroid  status     Advised re need for 02 with ex to burn fat and montior at peak ex levels to assure > 90%   F/u q 6 m sooner prn   Each maintenance medication was reviewed in detail including emphasizing most importantly the difference between maintenance and prns and under what circumstances the prns are to be triggered using an action plan format where appropriate.  Total time for H and P, chart review, counseling, reviewing dpi  device(s) , directly observing portions of ambulatory 02 saturation study/ and generating customized AVS unique to this office visit / same day charting = 43 min summary f/u ov

## 2024-02-09 NOTE — Progress Notes (Signed)
 Pt advised this is not a disability and he is stable, verbalized understanding. Nothing farther needed.

## 2024-02-09 NOTE — Telephone Encounter (Signed)
 Copied from CRM [id number]. Topic: Clinical - Lab/Test Results >> Feb 05, 2024  3:01 PM Shawn Duke wrote: Reason for CRM: Pt returning Shawn Duke phone call, advised pt of PFT results. Pt would like to know if this is considered a disability and had more questions regarding his results. Contacted [facility] [place], no answer. Please f/u with pt.  Pt seen in clinic 02/08/2024

## 2024-02-09 NOTE — Assessment & Plan Note (Addendum)
 Born prematurely ? 1 lb x one year in hosp and on 02 until age 38  - placed on 02 2lpm on 10/24/23 (see admit)  - 11/30/2023   Walked on 2lpm cont  x  3  lap(s) =  approx 450  ft  @ fast pace, stopped due to end of study  with lowest 02 sats 92% min sob   - 02/08/2024   Walked on RA  x  3  lap(s) =  approx 450  ft  @ mod to fast  pace, stopped due to end of study  with lowest 02 sats 87% and sob last lap  >>> rec 2lpm with exertion and sleep, no need at rest or slow activities    I don't believe he'll be able to do any kind of work that requires continuous walking or lifting with deskwork his only option at this point.

## 2024-03-19 ENCOUNTER — Telehealth: Payer: Self-pay

## 2024-03-19 NOTE — Telephone Encounter (Signed)
 error

## 2024-03-20 ENCOUNTER — Telehealth: Payer: Self-pay

## 2024-03-20 NOTE — Telephone Encounter (Unsigned)
 Copied from CRM [id number]. Topic: Medical Record Request - Records Request >> Mar 18, 2024 10:41 AM Shawn Duke wrote: Reason for CRM: Pt calling in regards to disability paperwork he dropped off on last week and wanted to f/u. >> Mar 20, 2024  4:01 PM Shawn Duke wrote: Pt is contacting clinic regarding the Attending Physician form he dropped off today(12/17). He is requesting if the form can be faxed to [facility] after completion.  Requested call back once form has been completed and sent to [facility]  CB#  [phone]

## 2024-03-21 NOTE — Telephone Encounter (Signed)
Attempted to call patient, no answer. I will try again later.

## 2024-03-21 NOTE — Telephone Encounter (Signed)
 Patient called back, I let him know his papers are ready up front.

## 2024-04-10 NOTE — Telephone Encounter (Signed)
 Patient called the office today requesting that his medical records be sent to [facility] company pt asked in specific was was needed patient statesI don't know just send them all the records patient advised that the [employer] company would need to send a fax to [employer] to request medical records, pt verbalizes understanding

## 2024-04-16 NOTE — Telephone Encounter (Signed)
 Patient called office today reports that the [facility] company is requesting records from 2004 including all work notes, patient advised that the [employer] incorporated would need to fax a medical release request requesting needed information

## 2024-04-24 ENCOUNTER — Ambulatory Visit: Admitting: Internal Medicine

## 2024-04-24 ENCOUNTER — Encounter: Payer: Self-pay | Admitting: Internal Medicine

## 2024-04-24 VITALS — BP 125/82 | HR 100 | Ht 62.0 in | Wt 261.0 lb

## 2024-04-24 DIAGNOSIS — J9611 Chronic respiratory failure with hypoxia: Secondary | ICD-10-CM | POA: Diagnosis not present

## 2024-04-24 DIAGNOSIS — Z6841 Body Mass Index (BMI) 40.0 and over, adult: Secondary | ICD-10-CM | POA: Diagnosis not present

## 2024-04-24 DIAGNOSIS — J449 Chronic obstructive pulmonary disease, unspecified: Secondary | ICD-10-CM | POA: Diagnosis not present

## 2024-04-24 DIAGNOSIS — R0609 Other forms of dyspnea: Secondary | ICD-10-CM

## 2024-04-24 DIAGNOSIS — R911 Solitary pulmonary nodule: Secondary | ICD-10-CM

## 2024-04-24 NOTE — Progress Notes (Signed)
 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 States that it started the day before while he was at work. Felt very warm and difficult to breathe. Denied any cough or other URI symptoms. Did take his albuterol  w/o much improvement. Denied using any controller inhaler currently. Denies tobacco/vaping and not exposed to any second hand. His job involves being around dust but no chemical inhalants. States that his PCP changed him recently to Labetalol for BP and has felt his resp symptoms are worse.    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 CTA negative for infiltrates or PE but noted underlying emphysematous disease. Start on scheduled Duonebs w/ systemic steroids. Will hold off on empiric abx given lack of cardinal symptoms. Discussed that would recommend starting on controller and initiated Breo. Will need this rx'd at discharge. HTN - BP within acceptable range. Advised that would stop his Labetalol given non-selectiveness and potential worsening of airway disease. Resume previous Chlorthalidone and Losartan. DM - Resume his home 70/30. Accuchecks and SSI to cover in event he develops steroid induced hyperglycemia. 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  10/24/2023 => POC glucose in good range this morning between  180s to 190s CMP => potassium low at 3.3 we will orally replete; creatinine elevated at 1.44 CBC => white count within normal limits at 10.7; hemoglobin within normal limits at 12.3; ABG done at 10/24/2023 at 4 AM => shows PaO2 low at 56, CO2 normal at 39.8  CTA chest with contrast => Negative for PE Heterogeneous areas of groundglass attenuation and lucency throughout both lung favored to represent areas of atelectasis and air trapping 3: Subsolid nodular 12 mm density in the left lower lobe. If not previously evaluated, CT follow-up in 6 months is recommended  Note: Patient does not have oxygen usage at home but is on 2 L here No empiric antibiotic therapy was required based on today's presentation  Discharge plan: => Prescription for [facility] Prescription for chlorthalidone and losartan Prescription for resume 70/30 Prescription for: Stop labetalol secondary to potential for worsening airway disease and use chlorthalidone and losartan instead Follow-up with PCP, Dr Orpha in the next 2 to 3 days for continuity of care   Patient discharged on Home O2? - yes    rx Prednisone  20 mg every day x 5 days. Dispense 5 pills. Take until all gone. We did a walk test with respiratory therapist and the test showed you did need oxygen with any kind of exertion. Case management is working on getting you the oxygen equipment. You also need CPAP equipment but one of the companies we use said you  had an unpaid bill with them and they will not give you a machine until bill is paid. I am giving you a work note to be out of work yesterday today and tomorrow, you can return to work on 26 October 2023 .       History of Present Illness  11/30/2023  Pulmonary/ 1st office eval/ Kristan Votta / [place] [facility]  maint on BREO/02  Chief Complaint  Patient presents with   Establish Care    Shob   Dyspnea:  much better on 02 2lpm  Cough: none Sleep: cpap x 2025 and now plugs in 2lpm  SABA use: none  02: as above -  does not use Patient Instructions  Plan A = Automatic = Always=    BREO one click each am  - tug on it hard to get it started and breath all the way in, holding breath for at least 5 seconds  - rinse mouth with water afterwards Plan B = Backup (to supplement plan A, not to replace it) Use your albuterol  inhaler as a rescue medication   Ok to try albuterol  15 min before an activity (on alternating days)  that you know would usually make you short of breath Please schedule a follow up office visit in 6 weeks, call sooner if needed     02/08/2024  f/u ov/[place] office/Allysen Lazo re: doe/ chronic resp failure maint on Anoro  for GOLD 3 copd from prematurity  Chief Complaint  Patient presents with   Shortness of Breath    Pft 10/23    Dyspnea:  even with 02 on struggles to get to [facility] parking spot / 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 Cough: none  Sleeping: cpap and 2lpm / bed is flat / 2 pillows/ sleeps fine  SABA use: none  02: 2lpm 24 /7    Patient Instructions  Stop BREO  Plan A = Automatic = Always=    Anoro one click each am  Plan B = Backup (to supplement plan A, not to replace it) Use your albuterol  inhaler as a rescue medication Make sure you check your oxygen saturation  AT  your highest level of activity (not after you stop)   to be sure it stays over 90%  You are due a follow up CT chest in Jan 2026  - call me if you don't hear from them by Feb 2026   Please schedule a follow up visit in  6  months but call sooner if needed     04/24/2024  f/u ov/[place] office/Eugean Arnott re: GOLD 3 copd  maint on Anoro   Chief Complaint  Patient presents with   Shortness of Breath    (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    Also denies any obvious fluctuation of symptoms with weather or environmental changes or other aggravating or alleviating factors except as outlined above   No unusual exposure hx or h/o childhood  pna/ asthma or knowledge of premature birth.  Current Allergies, Complete Past Medical History, Past Surgical History, Family History, and Social History were reviewed in [employer] record.  ROS  The following are not active complaints unless bolded Hoarseness, sore throat, dysphagia, dental problems, itching, sneezing,  nasal congestion or discharge of excess mucus or purulent secretions, ear ache,   fever, chills, sweats, unintended wt loss or wt gain, classically pleuritic or exertional cp,  orthopnea pnd or arm/hand swelling  or leg swelling, presyncope, palpitations, abdominal pain, anorexia, nausea, vomiting, diarrhea  or change in bowel habits or change in bladder habits, change in stools or change in urine, dysuria, hematuria,  rash, arthralgias, visual complaints, headache, numbness, weakness or ataxia or problems with walking or coordination,  change in mood or  memory.         Outpatient Medications Prior to Visit  Medication Sig Dispense Refill   albuterol  (PROAIR  HFA) 108 (90 Base) MCG/ACT inhaler 2 puffs every 4 hours as needed only  if you can't catch your breath 18 g 1   HUMULIN 70/30 (70-30) 100 UNIT/ML injection INJECT 30 UNITS UNDER THE SKIN IN THE MORNING AND 15 UNITS AT night     hydrOXYzine (ATARAX) 25 MG tablet Take 25 mg by mouth daily.     losartan (COZAAR) 100 MG tablet Take 100 mg by mouth daily.     potassium chloride (KLOR-CON M) 10 MEQ tablet Take 10 mEq by mouth daily.     umeclidinium-vilanterol (ANORO ELLIPTA ) 62.5-25 MCG/ACT AEPB Inhale 1 puff into the lungs  daily. 60 each 3   chlorthalidone (HYGROTON) 25 MG tablet Take 75 mg by mouth 3 (three) times daily. (Patient not taking: Reported on 04/24/2024)     hydrochlorothiazide  (HYDRODIURIL ) 25 MG tablet Take 1 tablet (25 mg total) by mouth daily. (Patient not taking: Reported on 04/24/2024) 30 tablet 0   insulin regular (NOVOLIN R) 100 units/mL injection Inject into the skin 3 (three) times daily before meals. (Patient not taking: Reported on 04/24/2024)     No facility-administered medications prior to visit.      Past Medical History:  Diagnosis Date   Hypertension       Objective:   Wts   04/24/2024       261    02/08/24 257 lb (116.6 kg)  11/30/23 255 lb 6.4 oz (115.8 kg)    Vital signs reviewed  04/24/2024  - Note at rest 02 sats  96% on 2lpm cont    General appearance:    pleasant amb MO ( by BMI) bm nad      HEENT :  Oropharynx  clear   Nasal turbinates nl    NECK :  without JVD/Nodes/TM/ nl carotid upstrokes bilaterally   LUNGS: no acc muscle use,  Mod barrel  contour chest wall with bilateral  Distant bs s audible wheeze and  without cough on insp or exp maneuvers and mod  Hyperresonant  to  percussion bilaterally     CV:  RRR  no s3 or murmur or increase in P2, and no edema   ABD:  obese soft and nontender with pos mid insp Hoover's  in the supine position. No bruits or organomegaly appreciated, bowel sounds nl  MS:   Ext warm without deformities or   obvious joint restrictions , calf tenderness, cyanosis or clubbing  SKIN: warm and dry without lesions    NEURO:  alert, approp, nl sensorium with  no motor or cerebellar  deficits apparent.               Assessment   Assessment & Plan DOE (dyspnea on exertion)  COPD mixed type [facility]) Never smoker -  Born prematurely at wt 1 lb x 1 year in hosp with GOLD 3 criteria 01/2024  - worse summer 2025 > see admit to [facility] 10/24/23 with ? 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  >>> referred to pulmonary rehab  >>> Paperwork completed from the [employer] confirming permanently unable to do his previous job.   Chronic respiratory failure with hypoxia (HCC) Born prematurely ? 1 lb x one year in hosp and on 02 until age 82  - placed on 02 2lpm on 10/24/23 (see admit)  - 11/30/2023   Walked on 2lpm cont  x  3  lap(s) =  approx 450  ft  @ fast pace, stopped due to end of study  with lowest 02 sats 92% min sob   - 02/08/2024   Walked on RA  x  3  lap(s) =  approx 450  ft  @ mod to fast  pace, stopped due to end of study  with lowest 02 sats 87% and sob last lap  >>> rec 2lpm with exertion and sleep, no need at rest or slow activities    Reminded him to titrate up 02 for highest levels of exertion:  walking uphill to MB in oder to help leg muscles function better and burn fat effectively   see avs for instructions unique to this ov   Morbid obesity due to excess calories (HCC) Body mass index is 47.74 kg/m.  -  trending up  No results found for: TSH    Contributing to doe and risk of GERD/dvt/ PE  >>>   reviewed the need and the process to achieve and maintain neg calorie balance > defer f/u primary care including intermittently monitoring thyroid  status         Each maintenance medication was reviewed in detail including emphasizing most importantly the difference between maintenance and prns and under what circumstances the prns are to be triggered using an action plan format where appropriate.  Total time for H and P, chart review, counseling, reviewing DPI elipta/  HFA/ 02/ pulse ox/  device(s) and generating customized AVS unique to this office visit / same day charting = 34 min           AVS  Patient Instructions  You could do vocational rehab if offered   I am referring you to  Pulmonary Rehab at [facility] and should be covered by insurance but is not likely to help you return to a job other than deskwork..   Please schedule a follow up visit in 6 months but call sooner if needed.   Ozell America, MD 04/24/2024                                  "

## 2024-04-24 NOTE — Patient Instructions (Addendum)
 You could do vocational rehab if offered   I am referring you to  Pulmonary Rehab at [facility] and should be covered by insurance but is not likely to help you return to a job other than deskwork..   Please schedule a follow up visit in 6 months but call sooner if needed.

## 2024-04-24 NOTE — Assessment & Plan Note (Addendum)
 Body mass index is 47.74 kg/m.  -  trending up  No results found for: TSH    Contributing to doe and risk of GERD/dvt/ PE  >>>   reviewed the need and the process to achieve and maintain neg calorie balance > defer f/u primary care including intermittently monitoring thyroid  status         Each maintenance medication was reviewed in detail including emphasizing most importantly the difference between maintenance and prns and under what circumstances the prns are to be triggered using an action plan format where appropriate.  Total time for H and P, chart review, counseling, reviewing DPI elipta/  HFA/ 02/ pulse ox/ device(s) and generating customized AVS unique to this office visit / same day charting = 34 min

## 2024-04-24 NOTE — Assessment & Plan Note (Addendum)
 Never smoker -  Born prematurely at wt 1 lb x 1 year in hosp with GOLD 3 criteria 01/2024  - worse summer 2025 > see admit to [facility] 10/24/23 with ? 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  >>> referred to pulmonary rehab  >>> Paperwork completed from the [employer] confirming permanently unable to do his previous job.

## 2024-04-24 NOTE — Assessment & Plan Note (Addendum)
 Born prematurely ? 1 lb x one year in hosp and on 02 until age 39  - placed on 02 2lpm on 10/24/23 (see admit)  - 11/30/2023   Walked on 2lpm cont  x  3  lap(s) =  approx 450  ft  @ fast pace, stopped due to end of study  with lowest 02 sats 92% min sob   - 02/08/2024   Walked on RA  x  3  lap(s) =  approx 450  ft  @ mod to fast  pace, stopped due to end of study  with lowest 02 sats 87% and sob last lap  >>> rec 2lpm with exertion and sleep, no need at rest or slow activities    Reminded him to titrate up 02 for highest levels of exertion:  walking uphill to MB in oder to help leg muscles function better and burn fat effectively   see avs for instructions unique to this ov

## 2024-04-26 ENCOUNTER — Ambulatory Visit (HOSPITAL_COMMUNITY)
Admission: RE | Admit: 2024-04-26 | Discharge: 2024-04-26 | Disposition: A | Source: Ambulatory Visit | Attending: Internal Medicine | Admitting: Internal Medicine

## 2024-04-26 DIAGNOSIS — R911 Solitary pulmonary nodule: Secondary | ICD-10-CM | POA: Insufficient documentation

## 2024-04-26 DIAGNOSIS — R0609 Other forms of dyspnea: Secondary | ICD-10-CM | POA: Insufficient documentation

## 2024-04-26 DIAGNOSIS — J449 Chronic obstructive pulmonary disease, unspecified: Secondary | ICD-10-CM

## 2024-04-26 NOTE — Progress Notes (Signed)
 Virtual orientation visit completed for pulmonary rehab with DOE/COPD. On-site orientation visit scheduled for 05/06/24 at 1 pm.

## 2024-04-30 NOTE — Progress Notes (Signed)
 Atc x1 lmtcb

## 2024-05-01 ENCOUNTER — Telehealth: Payer: Self-pay

## 2024-05-01 NOTE — Progress Notes (Signed)
 Called and relayed ct results pt confirmed

## 2024-05-01 NOTE — Telephone Encounter (Signed)
 Copied from CRM #[id number]. Topic: Clinical - Lab/Test Results >> Apr 30, 2024  4:19 PM Shawn Duke wrote: Reason for CRM: pt returning call about CT results  See result note

## 2024-05-01 NOTE — Telephone Encounter (Signed)
 Copied from CRM [id number]. Topic: Clinical - Lab/Test Results >> May 01, 2024 11:43 AM Corean SAUNDERS wrote: Reason for CRM: Relayed result note from Dr. Darlean 1/27  for chest ct: patient verbalized understanding and has no further questions.   See phone note

## 2024-05-06 ENCOUNTER — Encounter (HOSPITAL_COMMUNITY)

## 2024-05-13 ENCOUNTER — Encounter (HOSPITAL_COMMUNITY)

## 2024-07-17 ENCOUNTER — Ambulatory Visit: Admitting: Internal Medicine

## 2024-09-23 ENCOUNTER — Ambulatory Visit: Admitting: Internal Medicine
